# Patient Record
Sex: Male | Born: 1935
Health system: Southern US, Community
[De-identification: ages and names within clinical notes are randomized; demographics above are authoritative.]

## PROBLEM LIST (undated history)

## (undated) DIAGNOSIS — M1990 Unspecified osteoarthritis, unspecified site: Secondary | ICD-10-CM

## (undated) HISTORY — PX: MINOR HEMORRHOIDECTOMY: SHX6238

---

## 2001-01-22 ENCOUNTER — Ambulatory Visit (HOSPITAL_COMMUNITY): Admission: RE | Admit: 2001-01-22 | Discharge: 2001-01-22 | Payer: Self-pay | Admitting: Gastroenterology

## 2011-02-02 NOTE — Procedures (Signed)
[facility]  Patient:    Bryan Garza, Bryan Garza                     MRN: [id number] Proc. Date: 01/22/01 Adm. Date:  [id number] Attending:  Orland Mustard CC:         Hadassah Pais. Jeannetta Nap, M.D.   Procedure Report  PROCEDURE:  Colonoscopy.  MEDICATIONS:  Fentanyl 100 mcg, Versed 7.5 mg IV.  INDICATIONS:  A previous history of adenomatous polyps removed in [place]. This is done as a five year follow-up.  The patient actually did not have a five year follow-up, and this is the first colonoscopy in 10 years since those polyps were removed.  DESCRIPTION OF PROCEDURE:  The procedure had been explained to the patient and consent obtained.  With the patient in the left lateral decubitus position, the adult Olympus video colonoscope was inserted and advanced under direct visualization.  The prep was excellent.  We were able to advance to the cecum without difficulty.  The right lower quadrant was transilluminated, and the ileocecal valve was seen.  The scope was withdrawn.  The cecum, ascending colon, hepatic flexure, transverse colon, splenic flexure, descending, and sigmoid colon were seen well upon removal.  No polyps were seen.  There was no significant diverticula or internal hemorrhoids.  The patient tolerated the procedure well and was maintained on low-flow oxygen and pulse oximeter throughout the procedure.  ASSESSMENT:  No evidence of further colon polyps.  PLAN:  We will recommend repeating procedure in five years and will continue with yearly Hemoccults. DD:  01/22/01 TD:  01/22/01 Job: [id number]

## 2016-04-04 DIAGNOSIS — L2089 Other atopic dermatitis: Secondary | ICD-10-CM | POA: Diagnosis not present

## 2017-01-12 ENCOUNTER — Encounter: Payer: Self-pay | Admitting: Medical Oncology

## 2017-01-12 ENCOUNTER — Emergency Department
Admission: EM | Admit: 2017-01-12 | Discharge: 2017-01-12 | Disposition: A | Discharge status: Home / Self Care | Payer: PPO | Attending: Emergency Medicine | Admitting: Emergency Medicine

## 2017-01-12 DIAGNOSIS — K0889 Other specified disorders of teeth and supporting structures: Secondary | ICD-10-CM | POA: Diagnosis not present

## 2017-01-12 DIAGNOSIS — K029 Dental caries, unspecified: Secondary | ICD-10-CM | POA: Diagnosis not present

## 2017-01-12 DIAGNOSIS — K047 Periapical abscess without sinus: Secondary | ICD-10-CM | POA: Insufficient documentation

## 2017-01-12 MED ORDER — AMOXICILLIN 500 MG PO CAPS: 500.0000 mg | ORAL_CAPSULE | Freq: Three times a day (TID) | ORAL | 0 refills | Status: DC

## 2017-01-12 NOTE — ED Notes (Signed)
Patient is complaining of swelling to the left side of his mouth and foul smell.  Patient is also complaining of pain and burning when he blows his nose.  Patient is in no obvious distress at this time.  Patient states he spoke to his dentist about the swelling and his dentist didn't want to put patient on antibiotics.

## 2017-01-12 NOTE — Discharge Instructions (Signed)
OPTIONS FOR DENTAL FOLLOW UP CARE ° °[place] Department of Health and Human Services - Local Safety Net Dental Clinics °http://www.ncdhhs.gov/dph/oralhealth/services/safetynetclinics.htm °  °[facility] (336-562-3123) ° °[facility] [place] (919-933-9087) ° °[facility] (919-663-1744 ext [phone]) ° °[facility] ([phone]) ° °[facility] ([phone]) °This clinic caters to the indigent population and is on a lottery system. °Location: °[facility], [address], [place] °Clinic Hours: °Wednesdays from 6pm - 9pm, patients seen by a lottery system. °For dates, call or go to www.med.[facility].edu/[facility]/patients/Dental-[facility] °Services: °Cleanings, fillings and simple extractions. °Payment Options: °DENTAL WORK IS FREE OF CHARGE. Bring proof of income or support. °Best way to get seen: °Arrive at 5:15 pm - this is a lottery, NOT first come/first serve, so arriving earlier will not increase your chances of being seen. °  °  °[facility] °919-537-3737 °Select option 1 for emergencies °  °Location: °[facility], [address], [place] °Clinic Hours: °No walk-ins accepted - call the day before to schedule an appointment. °Check in times are 9:30 am and 1:30 pm. °Services: °Simple extractions, temporary fillings, pulpectomy/pulp debridement, uncomplicated abscess drainage. °Payment Options: °PAYMENT IS DUE AT THE TIME OF SERVICE.  Fee is usually $100-200, additional surgical procedures (e.g. abscess drainage) may be extra. °Cash, checks, Visa/MasterCard accepted.  Can file Medicaid if patient is covered for dental - patient should call case worker to check. °No discount for [facility] patients. °Best way to get seen: °MUST call the day before and get onto the schedule. Can usually be seen the next 1-2 days. No walk-ins accepted. °  °  °[facility] °[phone] °   °Location: °[facility], [address], [place] °Clinic Hours: °M, W, Th, F 8am or 1:30pm, Tues 9a or 1:30 - first come/first served. °Services: °Simple extractions, temporary fillings, uncomplicated abscess drainage.  You do not need to be an [place] resident. °Payment Options: °PAYMENT IS DUE AT THE TIME OF SERVICE. °Dental insurance, otherwise sliding scale - bring proof of income or support. °Depending on income and treatment needed, cost is usually $50-200. °Best way to get seen: °Arrive early as it is first come/first served. °  °  °[facility] °[phone] °  °Location: °[address] °Clinic Hours: °Mon-Thu 8a-5p °Services: °Most basic dental services including extractions and fillings. °Payment Options: °PAYMENT IS DUE AT THE TIME OF SERVICE. °Sliding scale, up to 50% off - bring proof if income or support. °Medicaid with dental option accepted. °Best way to get seen: °Call to schedule an appointment, can usually be seen within 2 weeks OR they will try to see walk-ins - show up at 8a or 2p (you may have to wait). °  °  °[facility] °919-245-2435 °[place] RESIDENTS ONLY °  °Location: °[facility], [address], [place], [place] [address] °Clinic Hours: By appointment only. °Monday - Thursday 8am-5pm, Friday 8am-12pm °Services: Cleanings, fillings, extractions. °Payment Options: °PAYMENT IS DUE AT THE TIME OF SERVICE. °Cash, Visa or MasterCard. Sliding scale - $30 minimum per service. °Best way to get seen: °Come in to office, complete packet and make an appointment - need proof of income °or support monies for each household member and proof of [place] residence. °Usually takes about a month to get in. °  °  °[facility] °[phone] °  °Location: °[address]   [place] °Clinic Hours: Walk-in Urgent Care Dental Services are offered Monday-Friday  mornings only. °The numbers of emergencies accepted daily is limited to the number of °providers available. °Maximum 15 - Mondays, Wednesdays & Thursdays °Maximum 10 - Tuesdays & Fridays °Services: °You do not need to be a  County resident to be seen for a dental emergency. °Emergencies are defined as pain, swelling, abnormal bleeding, or dental trauma. Walkins will receive x-rays if needed. °NOTE: Dental cleaning is not an emergency. °Payment Options: °PAYMENT IS DUE AT THE TIME OF SERVICE. °Minimum co-pay is $40.00 for uninsured patients. °Minimum co-pay is $3.00 for Medicaid with dental coverage. °Dental Insurance is accepted and must be presented at time of visit. °Medicare does not cover dental. °Forms of payment: Cash, credit card, checks. °Best way to get seen: °If not previously registered with the clinic, walk-in dental registration begins at 7:15 am and is on a first come/first serve basis. °If previously registered with the clinic, call to make an appointment. °  °  °[facility] °919-776-4359 °[place] RESIDENTS ONLY °  °Location: °[address], [place], [place] °Clinic Hours: °Mon-Thu 10a-2p °Services: Extractions only! °Payment Options: °FREE (donations accepted) - bring proof of income or support °Best way to get seen: °Call and schedule an appointment OR come at 8am on the 1st Monday of every month (except for holidays) when it is first come/first served. °  °  °[facility] °[phone] °  °Location: °[address], [place] °Clinic Hours: °Friday mornings °Services, Payment Options, Best way to get seen: °Call for info °

## 2017-01-12 NOTE — ED Provider Notes (Signed)
[facility] Emergency Department Provider Note  ____________________________________________  Time seen: Approximately 11:45 AM  I have reviewed the triage vital signs and the nursing notes.   HISTORY  Chief Complaint Dental Pain and Facial Pain    HPI Bryan Garza is a 83 y.o. male that presents to emergency with left-sided facial pain for 12 hours. Patient states that in the middle of the night foul-smelling drainage came out of patient's nose. He has not noticed any drainage today. Patient states that he has a bad lower molar. He saw a dentist last week and was not given antibiotics. Patient states that dentist was not sure what to do about patient's teeth so he was referred to another dentist. Patient never followed up with another dentist.He does not have a primary care provider here because he states that "he never gets sick. " Patient denies any recent illness.  No history of sinus infections. He denies fever, difficulty opening and closing mouth, shortness of breath, chest pain, nausea, vomiting, abdominal pain.   History reviewed. No pertinent past medical history.  There are no active problems to display for this patient.   No past surgical history on file.  Prior to Admission medications   Medication Sig Start Date End Date Taking? Authorizing Provider  amoxicillin (AMOXIL) 500 MG capsule Take 1 capsule (500 mg total) by mouth 3 (three) times daily. 01/12/17   Enid Derry, PA-C    Allergies Tetracyclines & related  No family history on file.  Social History Social History  Substance Use Topics  . Smoking status: Not on file  . Smokeless tobacco: Not on file  . Alcohol use Not on file     Review of Systems  Constitutional: No fever/chills Eyes: No visual changes. No discharge. ENT: Positive for rhinorrhea. Cardiovascular: No chest pain. Respiratory: Negative for cough. No SOB. Gastrointestinal: No abdominal pain.  No nausea,  no vomiting.  No diarrhea.  No constipation. Musculoskeletal: Negative for musculoskeletal pain. Skin: Negative for rash, abrasions, lacerations, ecchymosis. Neurological: Negative for headaches.   ____________________________________________   PHYSICAL EXAM:  VITAL SIGNS: ED Triage Vitals  Enc Vitals Group     BP 01/12/17 1056 (!) 154/87     Pulse Rate 01/12/17 1056 100     Resp 01/12/17 1056 18     Temp 01/12/17 1056 98.5 F (36.9 C)     Temp Source 01/12/17 1056 Oral     SpO2 01/12/17 1056 97 %     Weight 01/12/17 1051 155 lb (70.3 kg)     Height 01/12/17 1051  (1.702 m)     Head Circumference --      Peak Flow --      Pain Score 01/12/17 1050 0     Pain Loc --      Pain Edu? --      Excl. in GC? --      Constitutional: Alert and oriented. Well appearing and in no acute distress. Eyes: Conjunctivae are normal. PERRL. EOMI. No discharge. Head: Atraumatic. ENT: No frontal and maxillary sinus tenderness.      Ears: Tympanic membranes pearly gray with good landmarks. No discharge.      Nose: No congestion/rhinnorhea.      Mouth/Throat: Mucous membranes are moist. Oropharynx non-erythematous. Uvula midline. Poor dentition. Back left molar has gold crown. 3 mm firm mass adjacent to back left molar. Mild tenderness to palpation. Poor dentition throughout. No drainage. No TMJ pain. Neck: No stridor.   Hematological/Lymphatic/Immunilogical:  No cervical lymphadenopathy. Cardiovascular: Normal rate, regular rhythm.  Good peripheral circulation. Respiratory: Normal respiratory effort without tachypnea or retractions. Lungs CTAB. Good air entry to the bases with no decreased or absent breath sounds. Musculoskeletal: Full range of motion to all extremities. No gross deformities appreciated. Neurologic:  Normal speech and language. No gross focal neurologic deficits are appreciated.  Skin:  Skin is warm, dry and intact. No rash  noted.   ____________________________________________   LABS (all labs ordered are listed, but only abnormal results are displayed)  Labs Reviewed - No data to display ____________________________________________  EKG   ____________________________________________  RADIOLOGY  No results found.  ____________________________________________    PROCEDURES  Procedure(s) performed:    Procedures    Medications - No data to display   ____________________________________________   INITIAL IMPRESSION / ASSESSMENT AND PLAN / ED COURSE  Pertinent labs & imaging results that were available during my care of the patient were reviewed by me and considered in my medical decision making (see chart for details).  Review of the [place] CSRS was performed in accordance of the [facility] prior to dispensing any controlled drugs.    Patient's diagnosis is consistent with dental cavities. Vital signs and exam are reassuring. Patient states that mass next to back left molar is the reason that he saw the dentist last week and was referred to another dentist. Patient has poor dentition. No swelling or tenderness over sinuses or over cheek. Patient appears well and is staying well hydrated. Patient will be discharged home with prescriptions for amoxicillin. Patient is to follow up with dentist as needed or otherwise directed. Patient is given ED precautions to return to the ED for any worsening or new symptoms.     ____________________________________________  FINAL CLINICAL IMPRESSION(S) / ED DIAGNOSES  Final diagnoses:  Dental caries  Dental infection      NEW MEDICATIONS STARTED DURING THIS VISIT:  Discharge Medication List as of 01/12/2017 12:25 PM    START taking these medications   Details  amoxicillin (AMOXIL) 500 MG capsule Take 1 capsule (500 mg total) by mouth 3 (three) times daily., Starting Sat 01/12/2017, Print            This chart was dictated using voice  recognition software/Dragon. Despite best efforts to proofread, errors can occur which can change the meaning. Any change was purely unintentional.    Enid Derry, PA-C 01/12/17 1319    Jeanmarie Plant, MD 01/13/17 [id number]

## 2017-01-12 NOTE — ED Triage Notes (Signed)
Pt reports sinus pressure and pain that began yesterday.

## 2017-03-13 ENCOUNTER — Emergency Department
Admission: EM | Admit: 2017-03-13 | Discharge: 2017-03-13 | Disposition: A | Discharge status: Home / Self Care | Payer: PPO | Attending: Emergency Medicine | Admitting: Emergency Medicine

## 2017-03-13 ENCOUNTER — Encounter: Payer: Self-pay | Admitting: Emergency Medicine

## 2017-03-13 DIAGNOSIS — Z87891 Personal history of nicotine dependence: Secondary | ICD-10-CM | POA: Diagnosis not present

## 2017-03-13 DIAGNOSIS — R432 Parageusia: Secondary | ICD-10-CM | POA: Diagnosis not present

## 2017-03-13 DIAGNOSIS — R0981 Nasal congestion: Secondary | ICD-10-CM | POA: Insufficient documentation

## 2017-03-13 DIAGNOSIS — J0191 Acute recurrent sinusitis, unspecified: Secondary | ICD-10-CM | POA: Diagnosis not present

## 2017-03-13 MED ORDER — AMOXICILLIN 500 MG PO CAPS: 500.0000 mg | ORAL_CAPSULE | Freq: Three times a day (TID) | ORAL | 0 refills | Status: DC

## 2017-03-13 MED ORDER — FLUTICASONE PROPIONATE 50 MCG/ACT NA SUSP: 2.0000 | Freq: Every day | NASAL | 0 refills | Status: DC

## 2017-03-13 NOTE — ED Triage Notes (Signed)
Patient ambulatory to triage with steady gait, without difficulty or distress noted; pt reports 2 months ago had an dental abscess with odor; had extraction; this morning blew nose "and it burnt like fire and I started smelling that smell again, like a dead animal"

## 2017-03-13 NOTE — Discharge Instructions (Signed)
Follow-up with your doctor at [facility] if any continued problems. Begin taking antibiotics today for the next 10 days and begin using Flonase nasal spray. This spray goes in each nostril once a day.  Continue taking Zyrtec every day.

## 2017-03-13 NOTE — ED Notes (Signed)
See triage note  States he woke up with some nasal drainage  Blew his nose and then he developed an odor  No fever

## 2017-03-13 NOTE — ED Provider Notes (Signed)
[facility] Emergency Department Provider Note ____________________________________________  Time seen: 7:18 AM  I have reviewed the triage vital signs and the nursing notes.  HISTORY  Chief Complaint  mouth odor   HPI Bryan Garza is a 83 y.o. male is here with complaint of having an odor and bad taste in his mouth. Patient states that last time this occurred he had a sinus infection. Patient states he was given antibiotics and this improved greatly. He states this morning he woke up with the same symptoms. He continues to take Zyrtec over-the-counter for his allergy symptoms. He states occasionally when he blows his nose he also gets a lot of "infection" out of it. Patient denies any fever or chills. He states he had a dental abscess 2 months ago but had these teeth extracted. He denies any dental pain. He denies any pain only a bad since of smell.  History reviewed. No pertinent past medical history.  There are no active problems to display for this patient.   Past Surgical History:  Procedure Laterality Date  . MINOR HEMORRHOIDECTOMY      Prior to Admission medications   Medication Sig Start Date End Date Taking? Authorizing Provider  amoxicillin (AMOXIL) 500 MG capsule Take 1 capsule (500 mg total) by mouth 3 (three) times daily. 03/13/17   Tommi Rumps, PA-C  fluticasone (FLONASE) 50 MCG/ACT nasal spray Place 2 sprays into both nostrils daily. 03/13/17 03/13/18  Tommi Rumps, PA-C    Allergies Tetracyclines & related  No family history on file.  Social History Social History  Substance Use Topics  . Smoking status: Former Games developer  . Smokeless tobacco: Never Used  . Alcohol use No    Review of Systems  Constitutional: Negative for fever. Eyes: Negative for visual changes. ENT: Negative for sore throat. Positive for nasal congestion. Cardiovascular: Negative for chest pain. Respiratory: Negative for shortness of breath. Skin:  Negative for rash. Neurological: Negative for headaches, focal weakness or numbness. ____________________________________________  PHYSICAL EXAM:  VITAL SIGNS: ED Triage Vitals  Enc Vitals Group     BP 03/13/17 0604 (!) 175/95     Pulse Rate 03/13/17 0604 82     Resp 03/13/17 0604 20     Temp 03/13/17 0604 97.7 F (36.5 C)     Temp Source 03/13/17 0604 Oral     SpO2 03/13/17 0604 100 %     Weight 03/13/17 0602 155 lb (70.3 kg)     Height 03/13/17 0602 5\' 6"  (1.676 m)     Head Circumference --      Peak Flow --      Pain Score --      Pain Loc --      Pain Edu? --      Excl. in GC? --     Constitutional: Alert and oriented. Well appearing and in no distress. Head: Normocephalic and atraumatic. Eyes: Conjunctivae are normal.  Ears: Canals clear. TMs intact bilaterally. Nose: Mild congestion/rhinorrhea. Tenderness maxillary sinuses bilaterally to percussion. Mouth/Throat: Mucous membranes are moist. Moderate posterior drainage. Neck: Supple.  Hematological/Lymphatic/Immunological: No cervical lymphadenopathy. Cardiovascular: Normal rate, regular rhythm. Normal distal pulses. Respiratory: Normal respiratory effort. No wheezes/rales/rhonchi. Neurologic:  Normal gait without ataxia. Normal speech and language. No gross focal neurologic deficits are appreciated. Skin:  Skin is warm, dry and intact. No rash noted. Psychiatric: Mood and affect are normal. Patient exhibits appropriate insight and judgment. ___________________________________________  INITIAL IMPRESSION / ASSESSMENT AND PLAN / ED COURSE  Patient was given prescription for amoxicillin 500 mg 3 times a day for 10 days along with Flonase nasal spray. He is to continue taking Zyrtec daily as he has been doing. He is to follow-[facility] with [facility] if any continued problems.    ____________________________________________  FINAL CLINICAL IMPRESSION(S) / ED DIAGNOSES  Final diagnoses:  Acute  recurrent sinusitis, unspecified location     Eather ColasSummers, Rhonda L, PA-C 03/13/17 1549    Sharman CheekStafford, Phillip, MD 03/15/17 2317

## 2017-03-26 DIAGNOSIS — J0100 Acute maxillary sinusitis, unspecified: Secondary | ICD-10-CM | POA: Diagnosis not present

## 2017-03-26 DIAGNOSIS — J301 Allergic rhinitis due to pollen: Secondary | ICD-10-CM | POA: Diagnosis not present

## 2017-04-04 ENCOUNTER — Emergency Department
Admission: EM | Admit: 2017-04-04 | Discharge: 2017-04-04 | Disposition: A | Discharge status: Home / Self Care | Payer: PPO | Attending: Emergency Medicine | Admitting: Emergency Medicine

## 2017-04-04 ENCOUNTER — Encounter: Payer: Self-pay | Admitting: *Deleted

## 2017-04-04 DIAGNOSIS — R197 Diarrhea, unspecified: Secondary | ICD-10-CM | POA: Insufficient documentation

## 2017-04-04 DIAGNOSIS — Z87891 Personal history of nicotine dependence: Secondary | ICD-10-CM | POA: Insufficient documentation

## 2017-04-04 LAB — CBC WITH DIFFERENTIAL/PLATELET
Basophils Absolute: 0 10*3/uL (ref 0–0.1)
Basophils Relative: 0 %
EOS ABS: 0.1 10*3/uL (ref 0–0.7)
Eosinophils Relative: 1 %
HCT: 45.1 % (ref 40.0–52.0)
Hemoglobin: 15.2 g/dL (ref 13.0–18.0)
LYMPHS ABS: 2.6 10*3/uL (ref 1.0–3.6)
Lymphocytes Relative: 44 %
MCH: 30.3 pg (ref 26.0–34.0)
MCHC: 33.7 g/dL (ref 32.0–36.0)
MCV: 89.7 fL (ref 80.0–100.0)
MONO ABS: 0.5 10*3/uL (ref 0.2–1.0)
MONOS PCT: 9 %
Neutro Abs: 2.7 10*3/uL (ref 1.4–6.5)
Neutrophils Relative %: 46 %
PLATELETS: 219 10*3/uL (ref 150–440)
RBC: 5.02 MIL/uL (ref 4.40–5.90)
RDW: 13.6 % (ref 11.5–14.5)
WBC: 5.8 10*3/uL (ref 3.8–10.6)

## 2017-04-04 LAB — GASTROINTESTINAL PANEL BY PCR, STOOL (REPLACES STOOL CULTURE)
ASTROVIRUS: NOT DETECTED
Adenovirus F40/41: NOT DETECTED
Campylobacter species: NOT DETECTED
Cryptosporidium: NOT DETECTED
Cyclospora cayetanensis: NOT DETECTED
ENTAMOEBA HISTOLYTICA: NOT DETECTED
ENTEROAGGREGATIVE E COLI (EAEC): NOT DETECTED
ENTEROPATHOGENIC E COLI (EPEC): NOT DETECTED
ENTEROTOXIGENIC E COLI (ETEC): NOT DETECTED
Giardia lamblia: NOT DETECTED
NOROVIRUS GI/GII: NOT DETECTED
Plesimonas shigelloides: NOT DETECTED
Rotavirus A: NOT DETECTED
SAPOVIRUS (I, II, IV, AND V): NOT DETECTED
SHIGA LIKE TOXIN PRODUCING E COLI (STEC): NOT DETECTED
Salmonella species: NOT DETECTED
Shigella/Enteroinvasive E coli (EIEC): NOT DETECTED
VIBRIO CHOLERAE: NOT DETECTED
Vibrio species: NOT DETECTED
Yersinia enterocolitica: NOT DETECTED

## 2017-04-04 LAB — COMPREHENSIVE METABOLIC PANEL
ALT: 17 U/L (ref 17–63)
ANION GAP: 10 (ref 5–15)
AST: 21 U/L (ref 15–41)
Albumin: 4.3 g/dL (ref 3.5–5.0)
Alkaline Phosphatase: 50 U/L (ref 38–126)
BUN: 21 mg/dL — ABNORMAL HIGH (ref 6–20)
CHLORIDE: 104 mmol/L (ref 101–111)
CO2: 25 mmol/L (ref 22–32)
Calcium: 9.7 mg/dL (ref 8.9–10.3)
Creatinine, Ser: 1.19 mg/dL (ref 0.61–1.24)
GFR, EST NON AFRICAN AMERICAN: 56 mL/min — AB (ref >60)
Glucose, Bld: 112 mg/dL — ABNORMAL HIGH (ref 65–99)
Potassium: 4 mmol/L (ref 3.5–5.1)
SODIUM: 139 mmol/L (ref 135–145)
Total Bilirubin: 1 mg/dL (ref 0.3–1.2)
Total Protein: 7 g/dL (ref 6.5–8.1)

## 2017-04-04 LAB — C DIFFICILE QUICK SCREEN W PCR REFLEX
C DIFFICLE (CDIFF) ANTIGEN: NEGATIVE
C Diff interpretation: NOT DETECTED
C Diff toxin: NEGATIVE

## 2017-04-04 NOTE — ED Provider Notes (Signed)
[facility] Emergency Department Provider Note       Time seen: ----------------------------------------- 9:51 AM on 04/04/2017 -----------------------------------------     I have reviewed the triage vital signs and the nursing notes.   HISTORY   Chief Complaint Diarrhea    HPI Bryan Garza is a 83 y.o. male who presents to the ED for diarrhea. Patient states he had diarrhea several times this morning as well as last night. He is currently on clindamycin for treatment of a sinus infection or dental abscess. Prior to that he was taking amoxicillin. Patient is concerned the infection is no better and that his symptoms indicate worsening infections. Patient states acid from the infection is hurting his stomach. He's been taking Pepto-Bismol with some relief.   History reviewed. No pertinent past medical history.  There are no active problems to display for this patient.   Past Surgical History:  Procedure Laterality Date  . MINOR HEMORRHOIDECTOMY      Allergies Tetracyclines & related  Social History Social History  Substance Use Topics  . Smoking status: Former Games developermoker  . Smokeless tobacco: Never Used  . Alcohol use No    Review of Systems Constitutional: Negative for fever. Eyes: Negative for vision changes ENT:  Negative for congestion, sore throat Cardiovascular: Negative for chest pain. Respiratory: Negative for shortness of breath. Gastrointestinal: Negative for abdominal pain,  Positive for GERD and diarrhea Genitourinary: Negative for dysuria. Musculoskeletal: Negative for back pain. Skin: Negative for rash. Neurological: Negative for headaches, focal weakness or numbness.  All systems negative/normal/unremarkable except as stated in the HPI  ____________________________________________   PHYSICAL EXAM:  VITAL SIGNS: ED Triage Vitals [04/04/17 0906]  Enc Vitals Group     BP (!) 158/105     Pulse Rate 85     Resp  15     Temp 98.6 F (37 C)     Temp Source Oral     SpO2 98 %     Weight 155 lb (70.3 kg)     Height 5\' 7"  (1.702 m)     Head Circumference      Peak Flow      Pain Score      Pain Loc      Pain Edu?      Excl. in GC?     Constitutional: Alert and oriented. Well appearing and in no distress. Eyes: Conjunctivae are normal. Normal extraocular movements. ENT   Head: Normocephalic and atraumatic.   Nose: No congestion/rhinnorhea.   Mouth/Throat: Mucous membranes are moist.Intraoral exam is normal   Neck: No stridor. Cardiovascular: Normal rate, regular rhythm. No murmurs, rubs, or gallops. Respiratory: Normal respiratory effort without tachypnea nor retractions. Breath sounds are clear and equal bilaterally. No wheezes/rales/rhonchi. Gastrointestinal: Soft and nontender. Normal bowel sounds Musculoskeletal: Nontender with normal range of motion in extremities. No lower extremity tenderness nor edema. Neurologic:  Normal speech and language. No gross focal neurologic deficits are appreciated.  Skin:  Skin is warm, dry and intact. No rash noted. Psychiatric: Mood and affect are normal. Speech and behavior are normal.  ____________________________________________  ED COURSE:  Pertinent labs & imaging results that were available during my care of the patient were reviewed by me and considered in my medical decision making (see chart for details). Patient presents for likely diarrhea secondary to antibiotic use, we will assess with labs and imaging as indicated.   Procedures ____________________________________________   LABS (pertinent positives/negatives)  Labs Reviewed  COMPREHENSIVE METABOLIC PANEL -  Abnormal; Notable for the following:       Result Value   Glucose, Bld 112 (*)    BUN 21 (*)    GFR calc non Af Amer 56 (*)    All other components within normal limits  C DIFFICILE QUICK SCREEN W PCR REFLEX  GASTROINTESTINAL PANEL BY PCR, STOOL (REPLACES STOOL  CULTURE)  CBC WITH DIFFERENTIAL/PLATELET  ____________________________________________  FINAL ASSESSMENT AND PLAN  Diarrhea  Plan: Patient's labs were dictated above. Patient had presented for diarrhea and I have advised that he stop his antibiotics that he is taking orally. I don't see any active infection and I have encouraged him to continue taking probiotics at home. He is stable for outpatient follow-up.   Emily Filbert, MD   Note: This note was generated in part or whole with voice recognition software. Voice recognition is usually quite accurate but there are transcription errors that can and very often do occur. I apologize for any typographical errors that were not detected and corrected.     Emily Filbert, MD 04/04/17 1226

## 2017-04-04 NOTE — ED Notes (Signed)
Pt given warm blanket, pt made aware of need for stool sample, denies any needs

## 2017-04-04 NOTE — ED Triage Notes (Signed)
Pt arrives from home, states he is currently on clindamycin for a sinus infection left dental abscess, states he was already on amxocillin without improvement, states "the acid from the infection is hurting my stomach", states he has been taking pepto bismal in the AM for relief, states diarrhea for 1 week, denies any pain at present, pt awake and alert in no acute distress

## 2018-01-08 ENCOUNTER — Emergency Department
Admission: EM | Admit: 2018-01-08 | Discharge: 2018-01-09 | Disposition: A | Discharge status: Home / Self Care | Payer: PPO | Attending: Emergency Medicine | Admitting: Emergency Medicine

## 2018-01-08 ENCOUNTER — Encounter: Payer: Self-pay | Admitting: Emergency Medicine

## 2018-01-08 ENCOUNTER — Other Ambulatory Visit: Payer: Self-pay

## 2018-01-08 ENCOUNTER — Emergency Department: Payer: PPO

## 2018-01-08 DIAGNOSIS — K5900 Constipation, unspecified: Secondary | ICD-10-CM

## 2018-01-08 DIAGNOSIS — Z79899 Other long term (current) drug therapy: Secondary | ICD-10-CM | POA: Diagnosis not present

## 2018-01-08 DIAGNOSIS — R110 Nausea: Secondary | ICD-10-CM | POA: Diagnosis not present

## 2018-01-08 DIAGNOSIS — Z87891 Personal history of nicotine dependence: Secondary | ICD-10-CM | POA: Insufficient documentation

## 2018-01-08 DIAGNOSIS — R109 Unspecified abdominal pain: Secondary | ICD-10-CM | POA: Diagnosis not present

## 2018-01-08 DIAGNOSIS — K5641 Fecal impaction: Secondary | ICD-10-CM | POA: Insufficient documentation

## 2018-01-08 DIAGNOSIS — R197 Diarrhea, unspecified: Secondary | ICD-10-CM | POA: Diagnosis not present

## 2018-01-08 LAB — COMPREHENSIVE METABOLIC PANEL
ALBUMIN: 4.8 g/dL (ref 3.5–5.0)
ALK PHOS: 53 U/L (ref 38–126)
ALT: 19 U/L (ref 17–63)
AST: 26 U/L (ref 15–41)
Anion gap: 9 (ref 5–15)
BILIRUBIN TOTAL: 1.1 mg/dL (ref 0.3–1.2)
BUN: 23 mg/dL — AB (ref 6–20)
CO2: 26 mmol/L (ref 22–32)
CREATININE: 1.15 mg/dL (ref 0.61–1.24)
Calcium: 9.7 mg/dL (ref 8.9–10.3)
Chloride: 104 mmol/L (ref 101–111)
GFR calc Af Amer: >60 mL/min (ref >60)
GFR, EST NON AFRICAN AMERICAN: 58 mL/min — AB (ref >60)
Glucose, Bld: 128 mg/dL — ABNORMAL HIGH (ref 65–99)
Potassium: 4 mmol/L (ref 3.5–5.1)
Sodium: 139 mmol/L (ref 135–145)
TOTAL PROTEIN: 7.3 g/dL (ref 6.5–8.1)

## 2018-01-08 LAB — CBC
HCT: 44.6 % (ref 40.0–52.0)
Hemoglobin: 15.2 g/dL (ref 13.0–18.0)
MCH: 30.8 pg (ref 26.0–34.0)
MCHC: 34 g/dL (ref 32.0–36.0)
MCV: 90.5 fL (ref 80.0–100.0)
Platelets: 191 10*3/uL (ref 150–440)
RBC: 4.92 MIL/uL (ref 4.40–5.90)
RDW: 13.9 % (ref 11.5–14.5)
WBC: 13 10*3/uL — AB (ref 3.8–10.6)

## 2018-01-08 LAB — LIPASE, BLOOD: Lipase: 21 U/L (ref 11–51)

## 2018-01-08 MED ORDER — IOPAMIDOL (ISOVUE-300) INJECTION 61%
100.0000 mL | Freq: Once | INTRAVENOUS | Status: AC | PRN
  Administered 2018-01-08: 100 mL via INTRAVENOUS

## 2018-01-08 MED ORDER — SODIUM CHLORIDE 0.9 % IV BOLUS
1000.0000 mL | Freq: Once | INTRAVENOUS | Status: AC
Start: 2018-01-08 — End: 2018-01-09
  Administered 2018-01-09: 1000 mL via INTRAVENOUS

## 2018-01-08 NOTE — ED Provider Notes (Signed)
[facility] Emergency Department Provider Note  ____________________________________________   First MD Initiated Contact with Patient 01/08/18 2326     (approximate)  I have reviewed the triage vital signs and the nursing notes.   HISTORY  Chief Complaint Constipation and Abdominal Pain    HPI Bryan Garza is a 84 y.o. male who self presents to the emergency department with abdominal pain and nausea and constipation for the past 24 hours or so.  He is taken multiple over-the-counter medications with no relief.  He has not had a solid bowel movement in over 24 hours.  He denies history of abdominal surgeries.  His symptoms are now moderate to severe and he is quite distressed.  His abdominal pain is moderate to severe nonradiating.  Nothing seems to make it better or worse.  History reviewed. No pertinent past medical history.  There are no active problems to display for this patient.   Past Surgical History:  Procedure Laterality Date  . MINOR HEMORRHOIDECTOMY      Prior to Admission medications   Medication Sig Start Date End Date Taking? Authorizing Provider  amoxicillin (AMOXIL) 500 MG capsule Take 1 capsule (500 mg total) by mouth 3 (three) times daily. Patient not taking: Reported on 04/04/2017 03/13/17   Tommi RumpsSummers, Rhonda L, PA-C  fluticasone [facility](FLONASE) 50 MCG/ACT nasal spray Place 2 sprays into both nostrils daily. 03/13/17 03/13/18  Tommi RumpsSummers, Rhonda L, PA-C    Allergies Tetracyclines & related  No family history on file.  Social History Social History   Tobacco Use  . Smoking status: Former Games developermoker  . Smokeless tobacco: Never Used  Substance Use Topics  . Alcohol use: No  . Drug use: Not on file    Review of Systems Constitutional: No fever/chills Eyes: No visual changes. ENT: No sore throat. Cardiovascular: Denies chest pain. Respiratory: Denies shortness of breath. Gastrointestinal: Positive for abdominal pain.  Positive for  nausea, no vomiting.  No diarrhea.  Has a 4 constipation. Genitourinary: Negative for dysuria. Musculoskeletal: Negative for back pain. Skin: Negative for rash. Neurological: Negative for headaches, focal weakness or numbness.   ____________________________________________   PHYSICAL EXAM:  VITAL SIGNS: ED Triage Vitals [01/08/18 1956]  Enc Vitals Group     BP 137/84     Pulse Rate (!) 109     Resp 18     Temp 98 F (36.7 C)     Temp Source Oral     SpO2 97 %     Weight 145 lb (65.8 kg)     Height 5\' 7"  (1.702 m)     Head Circumference      Peak Flow      Pain Score 7     Pain Loc      Pain Edu?      Excl. in GC?     Constitutional: Alert and oriented x4 appears quite uncomfortable nontoxic no diaphoresis speaks full sentences Eyes: PERRL EOMI. Head: Atraumatic. Nose: No congestion/rhinnorhea. Mouth/Throat: No trismus Neck: No stridor.   Cardiovascular: Normal rate, regular rhythm. Grossly normal heart sounds.  Good peripheral circulation. Respiratory: Normal respiratory effort.  No retractions. Lungs CTAB and moving good air Gastrointestinal: Soft abdomen diffuse mild tenderness with no rebound or guarding no peritonitis Musculoskeletal: No lower extremity edema   Neurologic:  Normal speech and language. No gross focal neurologic deficits are appreciated. Skin:  Skin is warm, dry and intact. No rash noted. Psychiatric: Mood and affect are normal. Speech and behavior are  normal.    ____________________________________________   DIFFERENTIAL includes but not limited to  Small bowel obstruction, large bowel obstruction, volvulus, functional constipation ____________________________________________   LABS (all labs ordered are listed, but only abnormal results are displayed)  Labs Reviewed  COMPREHENSIVE METABOLIC PANEL - Abnormal; Notable for the following components:      Result Value   Glucose, Bld 128 (*)    BUN 23 (*)    GFR calc non Af Amer 58 (*)     All other components within normal limits  CBC - Abnormal; Notable for the following components:   WBC 13.0 (*)    All other components within normal limits  LIPASE, BLOOD    Lab work reviewed by me shows elevated white count which is nonspecific and could be secondary to pain __________________________________________  EKG   ____________________________________________  RADIOLOGY  CT abdomen pelvis reviewed by me consistent with fecal impaction ____________________________________________   PROCEDURES  Procedure(s) performed: yes  ------------------------------------------------------------------------------------------------------------------- Fecal Disimpaction Procedure Note:  Performed by me:  Patient placed in the lateral recumbent position with knees drawn towards chest. Nurse present for patient support. Large amount of hard brown stool removed. No complications during procedure.   ------------------------------------------------------------------------------------------------------------------    Procedures  Critical Care performed: no  Observation: no ____________________________________________   INITIAL IMPRESSION / ASSESSMENT AND PLAN / ED COURSE  Pertinent labs & imaging results that were available during my care of the patient were reviewed by me and considered in my medical decision making (see chart for details).  The patient arrives uncomfortable appearing with constipation and nausea and diffuse abdominal pain.  CT scan abdomen pelvis obtained to evaluate for bowel obstruction etc. and fortunately is negative although does show a significant fecal impaction.  I verbally consented the patient for disimpaction and gave him 2 mg of midazolam for anxiolysis.  With the patient in left lateral decubitus and nurse Erie Noe at bedside I was able to remove a large volume of hard stools.  Subsequent soapsuds enema performed and the patient had a large bowel  movement on his own.  His symptoms are nearly completely resolved.  I have encouraged him to remain well-hydrated and increase amount of fiber in his diet.  He verbalizes understanding and agreement with the plan.  Strict return precautions have been given.      ____________________________________________   FINAL CLINICAL IMPRESSION(S) / ED DIAGNOSES  Final diagnoses:  Fecal impaction (HCC)  Constipation, unspecified constipation type      NEW MEDICATIONS STARTED DURING THIS VISIT:  Discharge Medication List as of 01/09/2018 12:56 AM       Note:  This document was prepared using Dragon voice recognition software and may include unintentional dictation errors.     Merrily Brittle, MD 01/09/18 2213

## 2018-01-08 NOTE — ED Triage Notes (Signed)
Patient ambulatory to triage with steady gait, without difficulty or distress noted; pt reports constipation; last BM yesterday; taking OTC med without relief accomp lowrer abd pain

## 2018-01-09 MED ORDER — MIDAZOLAM HCL 5 MG/5ML IJ SOLN
2.0000 mg | Freq: Once | INTRAMUSCULAR | Status: AC
Start: 2018-01-09 — End: 2018-01-09
  Administered 2018-01-09: 2 mg via INTRAVENOUS
  Filled 2018-01-09: qty 5

## 2018-01-09 NOTE — Discharge Instructions (Signed)
Please increase the amount of fiber in your diet and make sure you remain well-hydrated.  Follow-up with your primary care physician as needed and return to the emergency department for any concerns.  It was a pleasure to take care of you today, and thank you for coming to our emergency department.  If you have any questions or concerns before leaving please ask the nurse to grab me and I'm more than happy to go through your aftercare instructions again.  If you were prescribed any opioid pain medication today such as Norco, Vicodin, Percocet, morphine, hydrocodone, or oxycodone please make sure you do not drive when you are taking this medication as it can alter your ability to drive safely.  If you have any concerns once you are home that you are not improving or are in fact getting worse before you can make it to your follow-up appointment, please do not hesitate to call 911 and come back for further evaluation.  Merrily Brittle, MD  Results for orders placed or performed during the hospital encounter of 01/08/18  Lipase, blood  Result Value Ref Range   Lipase 21 11 - 51 U/L  Comprehensive metabolic panel  Result Value Ref Range   Sodium 139 135 - 145 mmol/L   Potassium 4.0 3.5 - 5.1 mmol/L   Chloride 104 101 - 111 mmol/L   CO2 26 22 - 32 mmol/L   Glucose, Bld 128 (H) 65 - 99 mg/dL   BUN 23 (H) 6 - 20 mg/dL   Creatinine, Ser 4.09 0.61 - 1.24 mg/dL   Calcium 9.7 8.9 - 81.1 mg/dL   Total Protein 7.3 6.5 - 8.1 g/dL   Albumin 4.8 3.5 - 5.0 g/dL   AST 26 15 - 41 U/L   ALT 19 17 - 63 U/L   Alkaline Phosphatase 53 38 - 126 U/L   Total Bilirubin 1.1 0.3 - 1.2 mg/dL   GFR calc non Af Amer 58 (L) >60 mL/min   GFR calc Af Amer >60 >60 mL/min   Anion gap 9 5 - 15  CBC  Result Value Ref Range   WBC 13.0 (H) 3.8 - 10.6 K/uL   RBC 4.92 4.40 - 5.90 MIL/uL   Hemoglobin 15.2 13.0 - 18.0 g/dL   HCT 91.4 78.2 - 95.6 %   MCV 90.5 80.0 - 100.0 fL   MCH 30.8 26.0 - 34.0 pg   MCHC 34.0 32.0 - 36.0  g/dL   RDW 21.3 08.6 - 57.8 %   Platelets 191 150 - 440 K/uL   Dg Abdomen 1 View  Result Date: 01/08/2018 CLINICAL DATA:  Abdominal pain.  Constipation. EXAM: ABDOMEN - 1 VIEW COMPARISON:  None. FINDINGS: The bowel gas pattern is normal. Stool burden is unremarkable. No radio-opaque calculi or other significant radiographic abnormality are seen. IMPRESSION: Negative exam.  Stool burden is unremarkable. Electronically Signed   By: Drusilla Kanner M.D.   On: 01/08/2018 20:19   Ct Abdomen Pelvis W Contrast  Result Date: 01/08/2018 CLINICAL DATA:  Constipation. Last bowel movement yesterday. Lower abdominal pain. EXAM: CT ABDOMEN AND PELVIS WITH CONTRAST TECHNIQUE: Multidetector CT imaging of the abdomen and pelvis was performed using the standard protocol following bolus administration of intravenous contrast. CONTRAST:  ISOVUE-300 IOPAMIDOL (ISOVUE-300) INJECTION 61% COMPARISON:  None. FINDINGS: Lower chest: Lung bases are clear. Hepatobiliary: No focal liver abnormality is seen. No gallstones, gallbladder wall thickening, or biliary dilatation. Pancreas: Unremarkable. No pancreatic ductal dilatation or surrounding inflammatory changes. Spleen: Normal in  size without focal abnormality. Adrenals/Urinary Tract: Adrenal glands are unremarkable. Kidneys are normal, without renal calculi, focal lesion, or hydronephrosis. Bladder is unremarkable. Stomach/Bowel: Rectosigmoid colon is stool filled. Remainder the colon is fluid-filled. This may reflect constipation or impaction versus an infectious colitis with diarrhea. There is no colonic wall thickening or inflammatory stranding. Appendix is not identified. Stomach and small bowel are mostly decompressed. Vascular/Lymphatic: Aortic atherosclerosis. No enlarged abdominal or pelvic lymph nodes. Reproductive: Prostate gland is enlarged, measuring 5.1 cm diameter. Other: No free air or free fluid in the abdomen. Abdominal wall musculature appears intact.  Musculoskeletal: Degenerative changes in the spine and hips. No destructive bone lesions. IMPRESSION: 1. Nondilated colon is fluid-filled with stool-filled rectosigmoid colon. This may reflect constipation or impaction versus infectious colitis. No colonic wall thickening or inflammatory stranding. 2. Aortic atherosclerosis. 3. Enlarged prostate gland. Electronically Signed   By: Burman NievesWilliam  Stevens M.D.   On: 01/08/2018 23:49

## 2018-08-21 ENCOUNTER — Emergency Department: Payer: PPO

## 2018-08-21 ENCOUNTER — Other Ambulatory Visit: Payer: Self-pay

## 2018-08-21 ENCOUNTER — Encounter: Payer: Self-pay | Admitting: Emergency Medicine

## 2018-08-21 ENCOUNTER — Emergency Department
Admission: EM | Admit: 2018-08-21 | Discharge: 2018-08-21 | Disposition: A | Discharge status: Home / Self Care | Payer: PPO | Attending: Emergency Medicine | Admitting: Emergency Medicine

## 2018-08-21 DIAGNOSIS — M47816 Spondylosis without myelopathy or radiculopathy, lumbar region: Secondary | ICD-10-CM | POA: Insufficient documentation

## 2018-08-21 DIAGNOSIS — Z87891 Personal history of nicotine dependence: Secondary | ICD-10-CM | POA: Insufficient documentation

## 2018-08-21 DIAGNOSIS — M545 Low back pain, unspecified: Secondary | ICD-10-CM

## 2018-08-21 DIAGNOSIS — I7090 Unspecified atherosclerosis: Secondary | ICD-10-CM | POA: Diagnosis not present

## 2018-08-21 DIAGNOSIS — M25552 Pain in left hip: Secondary | ICD-10-CM | POA: Diagnosis not present

## 2018-08-21 DIAGNOSIS — M4306 Spondylolysis, lumbar region: Secondary | ICD-10-CM | POA: Diagnosis not present

## 2018-08-21 HISTORY — DX: Unspecified osteoarthritis, unspecified site: M19.90

## 2018-08-21 LAB — URINALYSIS, COMPLETE (UACMP) WITH MICROSCOPIC
BACTERIA UA: NONE SEEN
Bilirubin Urine: NEGATIVE
Glucose, UA: NEGATIVE mg/dL
Hgb urine dipstick: NEGATIVE
KETONES UR: 20 mg/dL — AB
Leukocytes, UA: NEGATIVE
NITRITE: NEGATIVE
PROTEIN: NEGATIVE mg/dL
Specific Gravity, Urine: 1.021 (ref 1.005–1.030)
pH: 5 (ref 5.0–8.0)

## 2018-08-21 MED ORDER — LIDOCAINE 5 % EX PTCH: 1.0000 | MEDICATED_PATCH | CUTANEOUS | 0 refills | Status: DC

## 2018-08-21 NOTE — Discharge Instructions (Signed)
Please return to the emergency department if back pain returns or worsens, nausea, vomiting, abdominal pain, urinary symptoms, any other symptoms concerning to you.  Your x-ray shows that you have some arthritis and some chronic compression of your lumbar spine and some arthritis of your hip.  There is no infection or indication of a kidney stone on your urinalysis. Please call [facility] to establish with primary care.

## 2018-08-21 NOTE — ED Provider Notes (Addendum)
[facility] Emergency Department Provider Note  ____________________________________________  Time seen: Approximately 2:39 PM  I have reviewed the triage vital signs and the nursing notes.   HISTORY  Chief Complaint Back Pain    HPI Bryan Garza is a 84 y.o. male presents emergency department for evaluation of left lower back pain last night.  Pain was in one specific location and does not radiate.  Patient states that he cannot find a position to get comfortable in the last night.  Pain gradually improved this morning and has currently resolved.  He denies any pain now.  No urinary symptoms or hematuria.  He has never had a kidney stone.  No recent illness.  No nausea, vomiting, abdominal pain.   Past Medical History:  Diagnosis Date  . Arthritis     There are no active problems to display for this patient.   Past Surgical History:  Procedure Laterality Date  . MINOR HEMORRHOIDECTOMY      Prior to Admission medications   Medication Sig Start Date End Date Taking? Authorizing Provider  amoxicillin (AMOXIL) 500 MG capsule Take 1 capsule (500 mg total) by mouth 3 (three) times daily. Patient not taking: Reported on 04/04/2017 03/13/17   Tommi Rumps, PA-C  fluticasone [facility]) 50 MCG/ACT nasal spray Place 2 sprays into both nostrils daily. 03/13/17 03/13/18  Bridget Hartshorn L, PA-C  lidocaine (LIDODERM) 5 % Place 1 patch onto the skin daily. Remove & Discard patch within 12 hours or as directed by MD 08/21/18   Enid Derry, PA-C    Allergies Tetracyclines & related  No family history on file.  Social History Social History   Tobacco Use  . Smoking status: Former Games developer  . Smokeless tobacco: Never Used  Substance Use Topics  . Alcohol use: No  . Drug use: Never     Review of Systems  Constitutional: No fever/chills Cardiovascular: No chest pain. Respiratory: NNo SOB. Gastrointestinal: No abdominal pain.  No nausea, no  vomiting.  Musculoskeletal: Positive for back pain.  Skin: Negative for rash, abrasions, lacerations, ecchymosis. Neurological: Negative for headaches, numbness or tingling   ____________________________________________   PHYSICAL EXAM:  VITAL SIGNS: ED Triage Vitals  Enc Vitals Group     BP 08/21/18 1322 (!) 154/85     Pulse Rate 08/21/18 1322 76     Resp 08/21/18 1322 16     Temp 08/21/18 1322 98.2 F (36.8 C)     Temp Source 08/21/18 1322 Oral     SpO2 08/21/18 1322 98 %     Weight 08/21/18 1323 150 lb (68 kg)     Height 08/21/18 1323 5\' 6"  (1.676 m)     Head Circumference --      Peak Flow --      Pain Score 08/21/18 1323 3     Pain Loc --      Pain Edu? --      Excl. in GC? --      Constitutional: Alert and oriented. Well appearing and in no acute distress. Eyes: Conjunctivae are normal. PERRL. EOMI. Head: Atraumatic. ENT:      Ears:      Nose: No congestion/rhinnorhea.      Mouth/Throat: Mucous membranes are moist.  Neck: No stridor.  Cardiovascular: Normal rate, regular rhythm.  Good peripheral circulation. Respiratory: Normal respiratory effort without tachypnea or retractions. Lungs CTAB. Good air entry to the bases with no decreased or absent breath sounds. Gastrointestinal: Bowel sounds 4 quadrants. Soft  and nontender to palpation. No guarding or rigidity. No palpable masses. No distention.  Musculoskeletal: Full range of motion to all extremities. No gross deformities appreciated.  No tenderness to palpation of back.  Full range of motion of bilateral lower extremities.  Normal gait. Neurologic:  Normal speech and language. No gross focal neurologic deficits are appreciated.  Skin:  Skin is warm, dry and intact. No rash noted. Psychiatric: Mood and affect are normal. Speech and behavior are normal. Patient exhibits appropriate insight and judgement.   ____________________________________________   LABS (all labs ordered are listed, but only abnormal  results are displayed)  Labs Reviewed  URINALYSIS, COMPLETE (UACMP) WITH MICROSCOPIC - Abnormal; Notable for the following components:      Result Value   Color, Urine YELLOW (*)    APPearance CLEAR (*)    Ketones, ur 20 (*)    All other components within normal limits   ____________________________________________  EKG   ____________________________________________  RADIOLOGY Lexine BatonI, Nayla Dias, personally viewed and evaluated these images (plain radiographs) as part of my medical decision making, as well as reviewing the written report by the radiologist.  Dg Lumbar Spine 2-3 Views  Result Date: 08/21/2018 CLINICAL DATA:  Left-sided back pain. EXAM: LUMBAR SPINE - 2-3 VIEW COMPARISON:  CT 01/08/2018. FINDINGS: Diffuse multilevel degenerative change. Minimal anterior wedging of T12 and L1 noted. This is stable. This may be a normal variant. No acute abnormality identified. Pelvic calcifications consistent phleboliths. IMPRESSION: 1. Diffuse multilevel degenerative change. Minimal anterior wedging of T12 and L1. This is stable. No acute abnormality identified. 2.  Aortoiliac atherosclerotic vascular disease. Electronically Signed   By: Maisie Fushomas  Register   On: 08/21/2018 15:01   Dg Hip Unilat W Or Wo Pelvis 2-3 Views Left  Result Date: 08/21/2018 CLINICAL DATA:  Left hip pain, no known injury, initial encounter EXAM: DG HIP (WITH OR WITHOUT PELVIS) 3V LEFT COMPARISON:  None. FINDINGS: The pelvic ring is intact. Degenerative changes of the lumbar spine and hip joints are noted. No acute fracture or dislocation is seen. No soft tissue abnormality is noted. IMPRESSION: Mild degenerative change without acute abnormality. Electronically Signed   By: Alcide CleverMark  Lukens M.D.   On: 08/21/2018 15:01    ____________________________________________    PROCEDURES  Procedure(s) performed:    Procedures    Medications - No data to  display   ____________________________________________   INITIAL IMPRESSION / ASSESSMENT AND PLAN / ED COURSE  Pertinent labs & imaging results that were available during my care of the patient were reviewed by me and considered in my medical decision making (see chart for details).  Review of the [place] CSRS was performed in accordance of the [facility] prior to dispensing any controlled drugs.     Patient presented the emergency department for evaluation of left low back pain last night that resolved late this morning.  Vital signs and exam are reassuring.  Patient denies any pain while in the emergency department. Patient denies any associated symptoms in addition to the back pain.  Pain does not radiate.  No indication of nephrolithiasis or infection on urinalysis.  Pain seems lower than kidneies.  Lumbar x-ray and hip x-ray consistent with osteoarthritis and findings were discussed with patient.  Patient will be discharged home with prescriptions for Lidoderm. Patient is to follow up with PCP as directed. Patient is given ED precautions to return to the ED for any worsening or new symptoms.     ____________________________________________  FINAL CLINICAL IMPRESSION(S) / ED DIAGNOSES  Final diagnoses:  Acute left-sided low back pain without sciatica  Spondylosis of lumbar region without myelopathy or radiculopathy  Atherosclerosis      NEW MEDICATIONS STARTED DURING THIS VISIT:  ED Discharge Orders         Ordered    lidocaine (LIDODERM) 5 %  Every 24 hours     08/21/18 1535              This chart was dictated using voice recognition software/Dragon. Despite best efforts to proofread, errors can occur which can change the meaning. Any change was purely unintentional.    Enid Derry, PA-C 08/21/18 1600    Enid Derry, PA-C 08/21/18 1601    Governor Rooks, MD 08/21/18 [phone]

## 2018-08-21 NOTE — ED Triage Notes (Signed)
Patient reports left sided back pain that started last night. Denies any known injury. Patient denies any urinary symptoms or history of kidney stones.

## 2018-08-21 NOTE — ED Notes (Addendum)
See triage note. No specific mechanism of injury pt is aware of. Denies difficulty/pain on urination. Family at bedside.

## 2020-03-10 DIAGNOSIS — H2523 Age-related cataract, morgagnian type, bilateral: Secondary | ICD-10-CM | POA: Diagnosis not present

## 2020-03-15 ENCOUNTER — Other Ambulatory Visit: Payer: Self-pay

## 2020-03-15 ENCOUNTER — Other Ambulatory Visit (INDEPENDENT_AMBULATORY_CARE_PROVIDER_SITE_OTHER): Payer: Self-pay | Admitting: Vascular Surgery

## 2020-03-15 ENCOUNTER — Emergency Department: Payer: PPO

## 2020-03-15 ENCOUNTER — Encounter: Payer: Self-pay | Admitting: Emergency Medicine

## 2020-03-15 ENCOUNTER — Inpatient Hospital Stay
Admission: EM | Admit: 2020-03-15 | Discharge: 2020-03-18 | DRG: 271 | Disposition: A | Discharge status: Home / Self Care | Payer: PPO | Attending: Internal Medicine | Admitting: Internal Medicine

## 2020-03-15 DIAGNOSIS — M1990 Unspecified osteoarthritis, unspecified site: Secondary | ICD-10-CM | POA: Diagnosis present

## 2020-03-15 DIAGNOSIS — I824Z2 Acute embolism and thrombosis of unspecified deep veins of left distal lower extremity: Secondary | ICD-10-CM | POA: Diagnosis not present

## 2020-03-15 DIAGNOSIS — Z03818 Encounter for observation for suspected exposure to other biological agents ruled out: Secondary | ICD-10-CM | POA: Diagnosis not present

## 2020-03-15 DIAGNOSIS — I82432 Acute embolism and thrombosis of left popliteal vein: Secondary | ICD-10-CM | POA: Diagnosis not present

## 2020-03-15 DIAGNOSIS — Z20822 Contact with and (suspected) exposure to covid-19: Secondary | ICD-10-CM | POA: Diagnosis not present

## 2020-03-15 DIAGNOSIS — Z87891 Personal history of nicotine dependence: Secondary | ICD-10-CM | POA: Diagnosis not present

## 2020-03-15 DIAGNOSIS — Z79899 Other long term (current) drug therapy: Secondary | ICD-10-CM | POA: Diagnosis not present

## 2020-03-15 DIAGNOSIS — Z01818 Encounter for other preprocedural examination: Secondary | ICD-10-CM | POA: Diagnosis not present

## 2020-03-15 DIAGNOSIS — R451 Restlessness and agitation: Secondary | ICD-10-CM | POA: Diagnosis not present

## 2020-03-15 DIAGNOSIS — I491 Atrial premature depolarization: Secondary | ICD-10-CM | POA: Diagnosis not present

## 2020-03-15 DIAGNOSIS — N179 Acute kidney failure, unspecified: Secondary | ICD-10-CM | POA: Diagnosis not present

## 2020-03-15 DIAGNOSIS — I82409 Acute embolism and thrombosis of unspecified deep veins of unspecified lower extremity: Secondary | ICD-10-CM | POA: Diagnosis present

## 2020-03-15 DIAGNOSIS — I4510 Unspecified right bundle-branch block: Secondary | ICD-10-CM | POA: Diagnosis not present

## 2020-03-15 DIAGNOSIS — I82402 Acute embolism and thrombosis of unspecified deep veins of left lower extremity: Secondary | ICD-10-CM | POA: Diagnosis not present

## 2020-03-15 DIAGNOSIS — N182 Chronic kidney disease, stage 2 (mild): Secondary | ICD-10-CM | POA: Diagnosis present

## 2020-03-15 DIAGNOSIS — Z881 Allergy status to other antibiotic agents status: Secondary | ICD-10-CM | POA: Diagnosis not present

## 2020-03-15 DIAGNOSIS — I447 Left bundle-branch block, unspecified: Secondary | ICD-10-CM | POA: Diagnosis not present

## 2020-03-15 DIAGNOSIS — I82412 Acute embolism and thrombosis of left femoral vein: Principal | ICD-10-CM | POA: Diagnosis present

## 2020-03-15 DIAGNOSIS — I82442 Acute embolism and thrombosis of left tibial vein: Secondary | ICD-10-CM | POA: Diagnosis not present

## 2020-03-15 DIAGNOSIS — I82890 Acute embolism and thrombosis of other specified veins: Secondary | ICD-10-CM | POA: Diagnosis not present

## 2020-03-15 LAB — CBC WITH DIFFERENTIAL/PLATELET
Abs Immature Granulocytes: 0.04 10*3/uL (ref 0.00–0.07)
Basophils Absolute: 0 10*3/uL (ref 0.0–0.1)
Basophils Relative: 0 %
Eosinophils Absolute: 0.1 10*3/uL (ref 0.0–0.5)
Eosinophils Relative: 1 %
HCT: 45.1 % (ref 39.0–52.0)
Hemoglobin: 15.7 g/dL (ref 13.0–17.0)
Immature Granulocytes: 1 %
Lymphocytes Relative: 27 %
Lymphs Abs: 2.2 10*3/uL (ref 0.7–4.0)
MCH: 30 pg (ref 26.0–34.0)
MCHC: 34.8 g/dL (ref 30.0–36.0)
MCV: 86.2 fL (ref 80.0–100.0)
Monocytes Absolute: 0.8 10*3/uL (ref 0.1–1.0)
Monocytes Relative: 10 %
Neutro Abs: 4.9 10*3/uL (ref 1.7–7.7)
Neutrophils Relative %: 61 %
Platelets: 199 10*3/uL (ref 150–400)
RBC: 5.23 MIL/uL (ref 4.22–5.81)
RDW: 12.9 % (ref 11.5–15.5)
WBC: 8.1 10*3/uL (ref 4.0–10.5)
nRBC: 0 % (ref 0.0–0.2)

## 2020-03-15 LAB — COMPREHENSIVE METABOLIC PANEL
ALT: 16 U/L (ref 0–44)
AST: 19 U/L (ref 15–41)
Albumin: 4.2 g/dL (ref 3.5–5.0)
Alkaline Phosphatase: 62 U/L (ref 38–126)
Anion gap: 13 (ref 5–15)
BUN: 24 mg/dL — ABNORMAL HIGH (ref 8–23)
CO2: 24 mmol/L (ref 22–32)
Calcium: 9.6 mg/dL (ref 8.9–10.3)
Chloride: 104 mmol/L (ref 98–111)
Creatinine, Ser: 1.47 mg/dL — ABNORMAL HIGH (ref 0.61–1.24)
GFR calc Af Amer: 50 mL/min — ABNORMAL LOW (ref >60)
GFR calc non Af Amer: 43 mL/min — ABNORMAL LOW (ref >60)
Glucose, Bld: 100 mg/dL — ABNORMAL HIGH (ref 70–99)
Potassium: 3.7 mmol/L (ref 3.5–5.1)
Sodium: 141 mmol/L (ref 135–145)
Total Bilirubin: 0.7 mg/dL (ref 0.3–1.2)
Total Protein: 7.5 g/dL (ref 6.5–8.1)

## 2020-03-15 LAB — PROTIME-INR
INR: 1 (ref 0.8–1.2)
Prothrombin Time: 12.4 seconds (ref 11.4–15.2)

## 2020-03-15 LAB — SARS CORONAVIRUS 2 BY RT PCR (HOSPITAL ORDER, PERFORMED IN ~~LOC~~ HOSPITAL LAB): SARS Coronavirus 2: NEGATIVE

## 2020-03-15 LAB — APTT: aPTT: 28 seconds (ref 24–36)

## 2020-03-15 LAB — HEPARIN LEVEL (UNFRACTIONATED): Heparin Unfractionated: 0.93 IU/mL — ABNORMAL HIGH (ref 0.30–0.70)

## 2020-03-15 MED ORDER — TRAMADOL HCL 50 MG PO TABS
50.0000 mg | ORAL_TABLET | Freq: Three times a day (TID) | ORAL | Status: DC | PRN
  Administered 2020-03-15: 50 mg via ORAL
  Filled 2020-03-15: qty 1

## 2020-03-15 MED ORDER — ADULT MULTIVITAMIN W/MINERALS CH
1.0000 | ORAL_TABLET | Freq: Every day | ORAL | Status: DC
  Administered 2020-03-15 – 2020-03-18 (×3): 1 via ORAL
  Filled 2020-03-15 (×3): qty 1

## 2020-03-15 MED ORDER — SODIUM CHLORIDE 0.9 % IV SOLN: INTRAVENOUS | Status: DC

## 2020-03-15 MED ORDER — SODIUM CHLORIDE 0.9% FLUSH
3.0000 mL | Freq: Two times a day (BID) | INTRAVENOUS | Status: DC
  Administered 2020-03-16 – 2020-03-17 (×2): 3 mL via INTRAVENOUS

## 2020-03-15 MED ORDER — HYDROCODONE-ACETAMINOPHEN 5-325 MG PO TABS
1.0000 | ORAL_TABLET | Freq: Four times a day (QID) | ORAL | Status: DC | PRN
  Administered 2020-03-15 – 2020-03-18 (×2): 1 via ORAL
  Filled 2020-03-15 (×2): qty 1

## 2020-03-15 MED ORDER — ONDANSETRON HCL 4 MG PO TABS: 4.0000 mg | ORAL_TABLET | Freq: Four times a day (QID) | ORAL | Status: DC | PRN

## 2020-03-15 MED ORDER — HEPARIN (PORCINE) 25000 UT/250ML-% IV SOLN
750.0000 [IU]/h | INTRAVENOUS | Status: DC
  Administered 2020-03-15: 1100 [IU]/h via INTRAVENOUS
  Administered 2020-03-16 (×2): 750 [IU]/h via INTRAVENOUS
  Filled 2020-03-15 (×2): qty 250

## 2020-03-15 MED ORDER — HEPARIN BOLUS VIA INFUSION
3500.0000 [IU] | Freq: Once | INTRAVENOUS | Status: AC
  Administered 2020-03-15: 3500 [IU] via INTRAVENOUS
  Filled 2020-03-15: qty 3500

## 2020-03-15 MED ORDER — ONDANSETRON HCL 4 MG/2ML IJ SOLN: 4.0000 mg | Freq: Four times a day (QID) | INTRAMUSCULAR | Status: DC | PRN

## 2020-03-15 NOTE — Consult Note (Signed)
ANTICOAGULATION CONSULT NOTE  Pharmacy Consult for Heparin Indication: DVT  Allergies  Allergen Reactions  . Tetracyclines & Related Nausea And Vomiting    Flu symptoms    Patient Measurements: Height: 5\' 6"  (167.6 cm) Weight: 70.3 kg (155 lb) IBW/kg (Calculated) : 63.8 Heparin Dosing Weight: 70.3 kg  Vital Signs: Temp: 98.7 F (37.1 C) (06/29 0958) Temp Source: Oral (06/29 0958) BP: 144/63 (06/29 0958) Pulse Rate: 75 (06/29 0958)  Labs: Recent Labs    03/15/20 1208 03/15/20 1210  HGB 15.7  --   HCT 45.1  --   PLT 199  --   APTT  --  28    CrCl cannot be calculated (Patient's most recent lab result is older than the maximum 21 days allowed.).   Medical History: Past Medical History:  Diagnosis Date  . Arthritis     Medications:  (Not in a hospital admission)  Scheduled:  Infusions:  PRN:  Anti-infectives (From admission, onward)   None      Assessment: Pharmacy consulted to start heparin for DVT. No DOAC use noted PTA. CBC stable. Baseline labs ordered.   Goal of Therapy:  Heparin level 0.3-0.7 units/ml Monitor platelets by anticoagulation protocol: Yes   Plan:  Give 3500 units bolus x 1 Start heparin infusion at 1100 units/hr Check anti-Xa level in 8 hours and daily while on heparin Continue to monitor H&H and platelets  03/17/20, PharmD, BCPS 03/15/2020,12:37 PM

## 2020-03-15 NOTE — ED Notes (Signed)
Pt provided w/ lunch tray at bedside.  

## 2020-03-15 NOTE — ED Notes (Signed)
Assisted pt to use the restroom.

## 2020-03-15 NOTE — ED Notes (Signed)
Admitting provider at bedside.

## 2020-03-15 NOTE — Consult Note (Signed)
ANTICOAGULATION CONSULT NOTE  Pharmacy Consult for Heparin Indication: DVT  Allergies  Allergen Reactions  . Tetracyclines & Related Nausea And Vomiting    Flu symptoms    Patient Measurements: Height: 5\' 6"  (167.6 cm) Weight: 70.3 kg (155 lb) IBW/kg (Calculated) : 63.8 Heparin Dosing Weight: 70.3 kg  Vital Signs: Temp: 98.4 F (36.9 C) (06/29 1919) Temp Source: Oral (06/29 1600) BP: 132/72 (06/29 1919) Pulse Rate: 64 (06/29 1919)  Labs: Recent Labs    03/15/20 1208 03/15/20 1210 03/15/20 2031  HGB 15.7  --   --   HCT 45.1  --   --   PLT 199  --   --   APTT  --  28  --   LABPROT 12.4  --   --   INR 1.0  --   --   HEPARINUNFRC  --   --  0.93*  CREATININE 1.47*  --   --     Estimated Creatinine Clearance: 34.4 mL/min (A) (by C-G formula based on SCr of 1.47 mg/dL (H)).   Medical History: Past Medical History:  Diagnosis Date  . Arthritis     Medications:  Medications Prior to Admission  Medication Sig Dispense Refill Last Dose  . Multiple Vitamin (MULTIVITAMIN WITH MINERALS) TABS tablet Take 1 tablet by mouth daily.   03/14/2020 at Unknown time   Scheduled:  Infusions:  PRN:  Anti-infectives (From admission, onward)   None      Assessment: Pharmacy consulted to start heparin for DVT. No DOAC use noted PTA. CBC stable. Baseline labs ordered.   Goal of Therapy:  Heparin level 0.3-0.7 units/ml Monitor platelets by anticoagulation protocol: Yes   Plan:  06/29 @ 2230 HL 0.93 supratherapeutic. Will reduce rate to 900 units/hr and will recheck HL at 0600, CBC check w/ am labs and will continue to monitor.  7/29, PharmD, BCPS 03/15/2020,10:38 PM

## 2020-03-15 NOTE — ED Provider Notes (Signed)
[facility] Emergency Department Provider Note    First MD Initiated Contact with Patient 03/15/20 1145     (approximate)  I have reviewed the triage vital signs and the nursing notes.   HISTORY  Chief Complaint Leg Pain and Leg Swelling    HPI GOVANNI PLEMONS Bryan Garza is a 86 y.o. male with no significant past medical history presents to the ER for several weeks of worsening achiness in his left leg as well as swelling.  Went to follow-up with his primary doctor but was sent to the ER for evaluation.  Denies any fevers.  No recent antibiotics.  Is not currently on any blood thinners.  He does not smoke.  Denies any recent surgeries.  States that discomfort is achy in nature.    Past Medical History:  Diagnosis Date  . Arthritis    No family history on file. Past Surgical History:  Procedure Laterality Date  . MINOR HEMORRHOIDECTOMY     There are no problems to display for this patient.     Prior to Admission medications   Medication Sig Start Date End Date Taking? Authorizing Provider  Multiple Vitamin (MULTIVITAMIN WITH MINERALS) TABS tablet Take 1 tablet by mouth daily.   Yes [provider]    Allergies Tetracyclines & related    Social History Social History   Tobacco Use  . Smoking status: Former Games developer  . Smokeless tobacco: Never Used  Substance Use Topics  . Alcohol use: No  . Drug use: Never    Review of Systems Patient denies headaches, rhinorrhea, blurry vision, numbness, shortness of breath, chest pain, edema, cough, abdominal pain, nausea, vomiting, diarrhea, dysuria, fevers, rashes or hallucinations unless otherwise stated above in HPI. ____________________________________________   PHYSICAL EXAM:  VITAL SIGNS: Vitals:   03/15/20 0958 03/15/20 1230  BP: (!) 144/63 140/83  Pulse: 75 68  Resp: 17 (!) 25  Temp: 98.7 F (37.1 C)   SpO2: 98% 98%    Constitutional: Alert and oriented.  Eyes: Conjunctivae  are normal.  Head: Atraumatic. Nose: No congestion/rhinnorhea. Mouth/Throat: Mucous membranes are moist.   Neck: No stridor. Painless ROM.  Cardiovascular: Normal rate, regular rhythm. Grossly normal heart sounds.  Good peripheral circulation. Respiratory: Normal respiratory effort.  No retractions. Lungs CTAB. Gastrointestinal: Soft and nontender. No distention. No abdominal bruits. No CVA tenderness. Genitourinary:  Musculoskeletal: No lower extremity tenderness , 1+ LLE edema.  NV intact distally.  No joint effusions. Neurologic:  Normal speech and language. No gross focal neurologic deficits are appreciated. No facial droop Skin:  Skin is warm, dry and intact. No rash noted. Psychiatric: Mood and affect are normal. Speech and behavior are normal.  ____________________________________________   LABS (all labs ordered are listed, but only abnormal results are displayed)  Results for orders placed or performed during the hospital encounter of 03/15/20 (from the past 24 hour(s))  CBC with Differential/Platelet     Status: None   Collection Time: 03/15/20 12:08 PM  Result Value Ref Range   WBC 8.1 4.0 - 10.5 K/uL   RBC 5.23 4.22 - 5.81 MIL/uL   Hemoglobin 15.7 13.0 - 17.0 g/dL   HCT 63.8 39 - 52 %   MCV 86.2 80.0 - 100.0 fL   MCH 30.0 26.0 - 34.0 pg   MCHC 34.8 30.0 - 36.0 g/dL   RDW 75.6 43.3 - 29.5 %   Platelets 199 150 - 400 K/uL   nRBC 0.0 0.0 - 0.2 %   Neutrophils  Relative % 61 %   Neutro Abs 4.9 1.7 - 7.7 K/uL   Lymphocytes Relative 27 %   Lymphs Abs 2.2 0.7 - 4.0 K/uL   Monocytes Relative 10 %   Monocytes Absolute 0.8 0 - 1 K/uL   Eosinophils Relative 1 %   Eosinophils Absolute 0.1 0 - 0 K/uL   Basophils Relative 0 %   Basophils Absolute 0.0 0 - 0 K/uL   Immature Granulocytes 1 %   Abs Immature Granulocytes 0.04 0.00 - 0.07 K/uL  Comprehensive metabolic panel     Status: Abnormal   Collection Time: 03/15/20 12:08 PM  Result Value Ref Range   Sodium 141 135 - 145  mmol/L   Potassium 3.7 3.5 - 5.1 mmol/L   Chloride 104 98 - 111 mmol/L   CO2 24 22 - 32 mmol/L   Glucose, Bld 100 (H) 70 - 99 mg/dL   BUN 24 (H) 8 - 23 mg/dL   Creatinine, Ser 8.56 (H) 0.61 - 1.24 mg/dL   Calcium 9.6 8.9 - 31.4 mg/dL   Total Protein 7.5 6.5 - 8.1 g/dL   Albumin 4.2 3.5 - 5.0 g/dL   AST 19 15 - 41 U/L   ALT 16 0 - 44 U/L   Alkaline Phosphatase 62 38 - 126 U/L   Total Bilirubin 0.7 0.3 - 1.2 mg/dL   GFR calc non Af Amer 43 (L) >60 mL/min   GFR calc Af Amer 50 (L) >60 mL/min   Anion gap 13 5 - 15  Protime-INR     Status: None   Collection Time: 03/15/20 12:08 PM  Result Value Ref Range   Prothrombin Time 12.4 11.4 - 15.2 seconds   INR 1.0 0.8 - 1.2  APTT     Status: None   Collection Time: 03/15/20 12:10 PM  Result Value Ref Range   aPTT 28 24 - 36 seconds   ____________________________________________  EKG My review and personal interpretation at Time: 13:04   Indication: dvt  Rate: 60  Rhythm: sinus Axis: left Other: iRBBB, no stemi or depressions ____________________________________________  RADIOLOGY  I personally reviewed all radiographic images ordered to evaluate for the above acute complaints and reviewed radiology reports and findings.  These findings were personally discussed with the patient.  Please see medical record for radiology report.  ____________________________________________   PROCEDURES  Procedure(s) performed:  .Critical Care Performed by: Willy Eddy, MD Authorized by: Willy Eddy, MD   Critical care provider statement:    Critical care time (minutes):  35   Critical care time was exclusive of:  Separately billable procedures and treating other patients   Critical care was necessary to treat or prevent imminent or life-threatening deterioration of the following conditions:  Circulatory failure   Critical care was time spent personally by me on the following activities:  Development of treatment plan with patient  or surrogate, discussions with consultants, evaluation of patient's response to treatment, examination of patient, obtaining history from patient or surrogate, ordering and performing treatments and interventions, ordering and review of laboratory studies, ordering and review of radiographic studies, pulse oximetry, re-evaluation of patient's condition and review of old charts      Critical Care performed: yes ____________________________________________   INITIAL IMPRESSION / ASSESSMENT AND PLAN / ED COURSE  Pertinent labs & imaging results that were available during my care of the patient were reviewed by me and considered in my medical decision making (see chart for details).   DDX: dvt, claudication, contusion, cellulitis  SENG LARCH Bryan Garza is a 86 y.o. who presents to the ED with symptoms as described above with work-up showing evidence of acute occlusive extensive left lower extremity DVT.  This is an unprovoked DVT based on history.  No contraindications to heparin.  I will place on IV heparin drip.  I consulted with vascular surgery who agrees would be candidate for thrombectomy.  Patient will be admitted to the hospitalist for medical management and further evaluation by vascular surgery.  Have discussed with the patient and available family all diagnostics and treatments performed thus far and all questions were answered to the best of my ability. The patient demonstrates understanding and agreement with plan.      The patient was evaluated in Emergency Department today for the symptoms described in the history of present illness. He/she was evaluated in the context of the global COVID-19 pandemic, which necessitated consideration that the patient might be at risk for infection with the SARS-CoV-2 virus that causes COVID-19. Institutional protocols and algorithms that pertain to the evaluation of patients at risk for COVID-19 are in a state of rapid change based on information  released by regulatory bodies including the CDC and federal and state organizations. These policies and algorithms were followed during the patient's care in the ED.  As part of my medical decision making, I reviewed the following data within the electronic MEDICAL RECORD NUMBER Nursing notes reviewed and incorporated, Labs reviewed, notes from prior ED visits and [place] Controlled Substance Database   ____________________________________________   FINAL CLINICAL IMPRESSION(S) / ED DIAGNOSES  Final diagnoses:  DVT of deep femoral vein, left (HCC)      NEW MEDICATIONS STARTED DURING THIS VISIT:  New Prescriptions   No medications on file     Note:  This document was prepared using Dragon voice recognition software and may include unintentional dictation errors.    Willy Eddy, MD 03/15/20 1306

## 2020-03-15 NOTE — Consult Note (Signed)
[facility] Vascular Consult Note  MRN : [id number]  Bryan Garza Bryan Garza is a 86 y.o. ([birth year]) male who presents with chief complaint of  Chief Complaint  Patient presents with  . Leg Pain  . Leg Swelling   History of Present Illness:  The patient is an 86 year old male with multiple medical issues see below who presented to the [facility] emergency department seeking medical attention due to progressively worsening left lower extremity pain and swelling.  Patient endorses a history of progressively worsening left lower extremity swelling and pain.  Patient notes his symptoms have been present for at least 2 weeks.  He denies any recent surgery, trauma, prolonged immobility, clotting/bleeding disorders or past DVT/PE.  Patient was admitted and seen by his primary care physician who recommended he seek medical attention in our emergency department.  Patient denies any shortness of breath or chest pain.  Patient denies any fever, nausea vomiting.  Patient is not on any blood thinner medication.  Vascular surgery was consulted by Dr. Joylene Igo for possible endovascular intervention.  Current Facility-Administered Medications  Medication Dose Route Frequency Provider Last Rate Last Admin  . heparin ADULT infusion 100 units/mL (25000 units/288mL sodium chloride 0.45%)  1,100 Units/hr Intravenous Continuous Ronnald Ramp, RPH 11 mL/hr at 03/15/20 1258 1,100 Units/hr at 03/15/20 1258  . multivitamin with minerals tablet 1 tablet  1 tablet Oral Daily Agbata, Tochukwu, MD   1 tablet at 03/15/20 1407  . ondansetron (ZOFRAN) tablet 4 mg  4 mg Oral Q6H PRN Agbata, Tochukwu, MD       Or  . ondansetron (ZOFRAN) injection 4 mg  4 mg Intravenous Q6H PRN Agbata, Tochukwu, MD      . sodium chloride flush (NS) 0.9 % injection 3 mL  3 mL Intravenous Q12H Agbata, Tochukwu, MD      . traMADol (ULTRAM) tablet 50 mg  50 mg Oral Q8H PRN Agbata, Tochukwu, MD        Current Outpatient Medications  Medication Sig Dispense Refill  . Multiple Vitamin (MULTIVITAMIN WITH MINERALS) TABS tablet Take 1 tablet by mouth daily.     Past Medical History:  Diagnosis Date  . Arthritis    Past Surgical History:  Procedure Laterality Date  . MINOR HEMORRHOIDECTOMY     Social History Social History   Tobacco Use  . Smoking status: Former Games developer  . Smokeless tobacco: Never Used  Substance Use Topics  . Alcohol use: No  . Drug use: Never   Family History Denies family history of peripheral artery disease, venous disease or renal/bleeding disorders.  Allergies  Allergen Reactions  . Tetracyclines & Related Nausea And Vomiting    Flu symptoms   REVIEW OF SYSTEMS (Negative unless checked)  Constitutional: [] Weight loss  [] Fever  [] Chills Cardiac: [] Chest pain   [] Chest pressure   [] Palpitations   [] Shortness of breath when laying flat   [] Shortness of breath at rest   [] Shortness of breath with exertion. Vascular:  [x] Pain in legs with walking   [x] Pain in legs at rest   [x] Pain in legs when laying flat   [] Claudication   [] Pain in feet when walking  [] Pain in feet at rest  [] Pain in feet when laying flat   [] History of DVT   [] Phlebitis   [x] Swelling in legs   [] Varicose veins   [] Non-healing ulcers Pulmonary:   [] Uses home oxygen   [] Productive cough   [] Hemoptysis   [] Wheeze  [] COPD   [] Asthma  Neurologic:  [] Dizziness  [] Blackouts   [] Seizures   [] History of stroke   [] History of TIA  [] Aphasia   [] Temporary blindness   [] Dysphagia   [] Weakness or numbness in arms   [] Weakness or numbness in legs Musculoskeletal:  [] Arthritis   [] Joint swelling   [] Joint pain   [] Low back pain Hematologic:  [] Easy bruising  [] Easy bleeding   [] Hypercoagulable state   [] Anemic  [] Hepatitis Gastrointestinal:  [] Blood in stool   [] Vomiting blood  [] Gastroesophageal reflux/heartburn   [] Difficulty swallowing. Genitourinary:  [] Chronic kidney disease   [] Difficult urination   [] Frequent urination  [] Burning with urination   [] Blood in urine Skin:  [] Rashes   [] Ulcers   [] Wounds Psychological:  [] History of anxiety   []  History of major depression.  Physical Examination  Vitals:   03/15/20 1230 03/15/20 1300 03/15/20 1400 03/15/20 1430  BP: 140/83 137/68 137/73 (!) 153/75  Pulse: 68 (!) 59 62 69  Resp: (!) 25 17 (!) 23 20  Temp:      TempSrc:      SpO2: 98% 98% 100% 100%  Weight:      Height:       Body mass index is 25.02 kg/m. Gen:  WD/WN, NAD Head: [place]/AT, No temporalis wasting. Prominent temp pulse not noted. Ear/Nose/Throat: Hearing grossly intact, nares w/o erythema or drainage, oropharynx w/o Erythema/Exudate Eyes: Sclera non-icteric, conjunctiva clear Neck: Trachea midline.  No JVD.  Pulmonary:  Good air movement, respirations not labored, equal bilaterally.  Cardiac: RRR, normal S1, S2. Vascular:  Vessel Right Left  Radial Palpable Palpable  Ulnar Palpable Palpable  Brachial Palpable Palpable  Carotid Palpable, without bruit Palpable, without bruit  Aorta Not palpable N/A  Femoral Palpable Palpable  Popliteal Palpable Palpable  PT Palpable Palpable  DP Palpable Palpable   Left lower extremity: Thigh soft.  Calf soft.  Extremities warm distally toes.  Minimal edema.  There is no acute vascular compromise to the leg at this time.  Motor/sensory is intact.  Gastrointestinal: soft, non-tender/non-distended. No guarding/reflex.  Musculoskeletal: M/S 5/5 throughout.  Extremities without ischemic changes.  No deformity or atrophy. No edema. Neurologic: Sensation grossly intact in extremities.  Symmetrical.  Speech is fluent. Motor exam as listed above. Psychiatric: Judgment intact, Mood & affect appropriate for pt's clinical situation. Dermatologic: No rashes or ulcers noted.  No cellulitis or open wounds. Lymph : No Cervical, Axillary, or Inguinal lymphadenopathy.  CBC Lab Results  Component Value Date   WBC 8.1 03/15/2020   HGB 15.7  03/15/2020   HCT 45.1 03/15/2020   MCV 86.2 03/15/2020   PLT 199 03/15/2020   BMET    Component Value Date/Time   NA 141 03/15/2020 1208   K 3.7 03/15/2020 1208   CL 104 03/15/2020 1208   CO2 24 03/15/2020 1208   GLUCOSE 100 (H) 03/15/2020 1208   BUN 24 (H) 03/15/2020 1208   CREATININE 1.47 (H) 03/15/2020 1208   CALCIUM 9.6 03/15/2020 1208   GFRNONAA 43 (L) 03/15/2020 1208   GFRAA 50 (L) 03/15/2020 1208   Estimated Creatinine Clearance: 34.4 mL/min (A) (by C-G formula based on SCr of 1.47 mg/dL (H)).  COAG Lab Results  Component Value Date   INR 1.0 03/15/2020   Radiology Venous Img Lower Unilateral Left  Result Date: 03/15/2020 CLINICAL DATA:  Left leg swelling pain EXAM: LEFT LOWER EXTREMITY VENOUS DOPPLER ULTRASOUND TECHNIQUE: Gray-scale sonography with graded compression, as well as color Doppler and duplex ultrasound were performed to evaluate the lower extremity  deep venous systems from the level of the common femoral vein and including the common femoral, femoral, profunda femoral, popliteal and calf veins including the posterior tibial, peroneal and gastrocnemius veins when visible. The superficial great saphenous vein was also interrogated. Spectral Doppler was utilized to evaluate flow at rest and with distal augmentation maneuvers in the common femoral, femoral and popliteal veins. COMPARISON:  None. FINDINGS: Contralateral Common Femoral Vein: Respiratory phasicity is normal and symmetric with the symptomatic side. No evidence of thrombus. Normal compressibility. Common Femoral Vein: Hypoechoic intraluminal thrombus. Vessel is partially compressible. Thrombus is nonocclusive. Saphenofemoral Junction: No evidence of thrombus. Normal compressibility and flow on color Doppler imaging. Profunda Femoral Vein: Hypoechoic intraluminal thrombus. Vessel is partially compressible. Thrombus is nonocclusive. Femoral Vein: Diffuse hypoechoic thrombus throughout. Vessel is  noncompressible. Thrombus is occlusive Popliteal Vein: Similar hypoechoic thrombus throughout. Vessel is noncompressible. Thrombus is occlusive Calf Veins: Occlusive thrombus extends into the calf tibial and peroneal veins. IMPRESSION: Positive exam for extensive left lower extremity femoropopliteal acute occlusive DVT extending into the calf veins. Electronically Signed   By: Judie Petit.  Shick M.D.   On: 03/15/2020 11:02   DG Chest Portable 1 View  Result Date: 03/15/2020 CLINICAL DATA:  Preoperative assessment EXAM: PORTABLE CHEST 1 VIEW COMPARISON:  None. FINDINGS: Lungs are clear. Heart size and pulmonary vascularity are normal. No adenopathy. There is upper thoracic levoscoliosis. IMPRESSION: No edema or airspace opacity.  Cardiac silhouette normal. Electronically Signed   By: Bretta Bang III M.D.   On: 03/15/2020 12:55   Assessment/Plan The patient is an 86 year old male with multiple medical issues see below who presented to the [facility] emergency department seeking medical attention due to progressively worsening left lower extremity pain and swelling.  1.  Left lower extremity DVT: Patient presents to the [facility] emergency department with a chief complaint of progressively worsening left lower extremity pain and swelling.  Symptoms have been present for last 2 weeks.  Venous duplex 02/17/20: Positive exam for extensive left lower extremity femoropopliteal acute occlusive DVT extending into the calf veins.  Patient presents with a unprovoked DVT.  Due to the extent the size of the patient's DVT, from the femoral-popliteal vein distally into the calf veins and its occlusive nature recommend the patient undergo a left lower extremity venous thrombolysis and thrombectomy in an attempt to lessen the clot burden, the patient's symptoms and decreased post phlebitic sequela.  Agree with admission initiation of heparin.  Procedure, risk and benefits  were explained to the patient.  All questions were answered.  The patient wished to proceed.  The patient's daughter, who was in the in attendance during this consult also agrees to proceed.  The patient understands and we will continue to monitor his DVT in the outpatient setting.  Patient understands that he will be on oral blood thinners for approximately 6 months.  2.  Chronic kidney disease: Patient's Cotton this morning was 1.47.  Last creatinine in our computer system on January 08, 2018 was 1.15.  Will gently hydrate overnight.  Patient understands it would be using contrast dye which could potentially worsen his kidney function.  We will try to minimize the contrast use.  The patient and his daughter who was in attendance during this consult agree.  3.  Arthritis: Asymptomatic at this time. Over-the-counter pain medications seem to work well. Followed by his primary care physician.  Discussed with Dr. Weldon Inches, PA-C  03/15/2020 3:28 PM  This note was created with Dragon medical transcription system.  Any error is purely unintentional

## 2020-03-15 NOTE — ED Triage Notes (Signed)
Pt reports pain behind his left knee and swelling to his left leg for the past month. Pt went to MD and was sent to the ED.

## 2020-03-15 NOTE — H&P (Signed)
History and Physical    Bryan Garza GYK:[id number] DOB: [birth year] DOA: 03/15/2020  PCP: Patient, No Pcp Per   Patient coming from: Home  I have personally briefly reviewed patient's old medical records in [facility]  Chief Complaint: Left leg pain and swelling  HPI: Bryan Garza is a 86 y.o. male with medical history significant for arthritis.  He presents to the emergency room for evaluation of a 1 month history of left leg pain and swelling.  He describes it as a constant discomfort in his left lower extremity.  He denies any trauma, denies having any fever or chills, denies any recent surgery or hospitalization.  He denies any weight loss, no loss of appetite and no night sweats. Left leg venous Doppler extensive left lower extremity femoropopliteal acute occlusive DVT extending into the calf veins. Twelve-lead EKG shows sinus rhythm with an incomplete left bundle branch block and premature atrial complexes   ED Course: Patient is an 86 year old male who presents to the ER for evaluation of left leg pain and swelling and is found to have extensive left lower extremity femoral-popliteal acute occlusive DVT extending into the calf veins.  Patient will be admitted to the hospital for further evaluation and vascular surgery has been consulted.    Review of Systems: As per HPI otherwise 10 point review of systems negative.    Past Medical History:  Diagnosis Date  . Arthritis     Past Surgical History:  Procedure Laterality Date  . MINOR HEMORRHOIDECTOMY       reports that he has quit smoking. He has never used smokeless tobacco. He reports that he does not drink alcohol and does not use drugs.  Allergies  Allergen Reactions  . Tetracyclines & Related Nausea And Vomiting    Flu symptoms    No family history on file.   Prior to Admission medications   Medication Sig Start Date End Date Taking? Authorizing Provider  Multiple Vitamin (MULTIVITAMIN WITH  MINERALS) TABS tablet Take 1 tablet by mouth daily.   Yes [provider]    Physical Exam: Vitals:   03/15/20 0957 03/15/20 0958 03/15/20 1230  BP:  (!) 144/63 140/83  Pulse:  75 68  Resp:  17 (!) 25  Temp:  98.7 F (37.1 C)   TempSrc:  Oral   SpO2:  98% 98%  Weight: 70.3 kg    Height: 5\' 6"  (1.676 m)       Vitals:   03/15/20 0957 03/15/20 0958 03/15/20 1230  BP:  (!) 144/63 140/83  Pulse:  75 68  Resp:  17 (!) 25  Temp:  98.7 F (37.1 C)   TempSrc:  Oral   SpO2:  98% 98%  Weight: 70.3 kg    Height: 5\' 6"  (1.676 m)      Constitutional: NAD, alert and oriented x 3 Eyes: PERRL, lids and conjunctivae normal ENMT: Mucous membranes are moist.  Neck: normal, supple, no masses, no thyromegaly Respiratory: clear to auscultation bilaterally, no wheezing, no crackles. Normal respiratory effort. No accessory muscle use.  Cardiovascular: Regular rate and rhythm, no murmurs / rubs / gallops. 1+ extremity edema involving the left leg. 2+ pedal pulses. No carotid bruits.  Abdomen: no tenderness, no masses palpated. No hepatosplenomegaly. Bowel sounds positive.  Musculoskeletal: no clubbing / cyanosis. No joint deformity upper and lower extremities.  Skin: no rashes, lesions, ulcers.  Neurologic: No gross focal neurologic deficit. Psychiatric: Normal mood and affect.   Labs on  Admission: I have personally reviewed following labs and imaging studies  CBC: Recent Labs  Lab 03/15/20 1208  WBC 8.1  NEUTROABS 4.9  HGB 15.7  HCT 45.1  MCV 86.2  PLT 199   Basic Metabolic Panel: Recent Labs  Lab 03/15/20 1208  NA 141  K 3.7  CL 104  CO2 24  GLUCOSE 100*  BUN 24*  CREATININE 1.47*  CALCIUM 9.6   GFR: Estimated Creatinine Clearance: 34.4 mL/min (A) (by C-G formula based on SCr of 1.47 mg/dL (H)). Liver Function Tests: Recent Labs  Lab 03/15/20 1208  AST 19  ALT 16  ALKPHOS 62  BILITOT 0.7  PROT 7.5  ALBUMIN 4.2   No results for input(s): LIPASE,  AMYLASE in the last 168 hours. No results for input(s): AMMONIA in the last 168 hours. Coagulation Profile: Recent Labs  Lab 03/15/20 1208  INR 1.0   Cardiac Enzymes: No results for input(s): CKTOTAL, CKMB, CKMBINDEX, TROPONINI in the last 168 hours. BNP (last 3 results) No results for input(s): PROBNP in the last 8760 hours. HbA1C: No results for input(s): HGBA1C in the last 72 hours. CBG: No results for input(s): GLUCAP in the last 168 hours. Lipid Profile: No results for input(s): CHOL, HDL, LDLCALC, TRIG, CHOLHDL, LDLDIRECT in the last 72 hours. Thyroid Function Tests: No results for input(s): TSH, T4TOTAL, FREET4, T3FREE, THYROIDAB in the last 72 hours. Anemia Panel: No results for input(s): VITAMINB12, FOLATE, FERRITIN, TIBC, IRON, RETICCTPCT in the last 72 hours. Urine analysis:    Component Value Date/Time   COLORURINE YELLOW (A) 08/21/2018 1420   APPEARANCEUR CLEAR (A) 08/21/2018 1420   LABSPEC 1.021 08/21/2018 1420   PHURINE 5.0 08/21/2018 1420   GLUCOSEU NEGATIVE 08/21/2018 1420   HGBUR NEGATIVE 08/21/2018 1420   BILIRUBINUR NEGATIVE 08/21/2018 1420   KETONESUR 20 (A) 08/21/2018 1420   PROTEINUR NEGATIVE 08/21/2018 1420   NITRITE NEGATIVE 08/21/2018 1420   LEUKOCYTESUR NEGATIVE 08/21/2018 1420    Radiological Exams on Admission: US Venous Img Lower Unilateral Left  Result Date: 03/15/2020 CLINICAL DATA:  Left leg swelling pain EXAM: LEFT LOWER EXTREMITY VENOUS DOPPLER ULTRASOUND TECHNIQUE: Gray-scale sonography with graded compression, as well as color Doppler and duplex ultrasound were performed to evaluate the lower extremity deep venous systems from the level of the common femoral vein and including the common femoral, femoral, profunda femoral, popliteal and calf veins including the posterior tibial, peroneal and gastrocnemius veins when visible. The superficial great saphenous vein was also interrogated. Spectral Doppler was utilized to evaluate flow at rest  and with distal augmentation maneuvers in the common femoral, femoral and popliteal veins. COMPARISON:  None. FINDINGS: Contralateral Common Femoral Vein: Respiratory phasicity is normal and symmetric with the symptomatic side. No evidence of thrombus. Normal compressibility. Common Femoral Vein: Hypoechoic intraluminal thrombus. Vessel is partially compressible. Thrombus is nonocclusive. Saphenofemoral Junction: No evidence of thrombus. Normal compressibility and flow on color Doppler imaging. Profunda Femoral Vein: Hypoechoic intraluminal thrombus. Vessel is partially compressible. Thrombus is nonocclusive. Femoral Vein: Diffuse hypoechoic thrombus throughout. Vessel is noncompressible. Thrombus is occlusive Popliteal Vein: Similar hypoechoic thrombus throughout. Vessel is noncompressible. Thrombus is occlusive Calf Veins: Occlusive thrombus extends into the calf tibial and peroneal veins. IMPRESSION: Positive exam for extensive left lower extremity femoropopliteal acute occlusive DVT extending into the calf veins. Electronically Signed   By: Judie Petit.  Shick M.D.   On: 03/15/2020 11:02   DG Chest Portable 1 View  Result Date: 03/15/2020 CLINICAL DATA:  Preoperative assessment EXAM: PORTABLE CHEST 1  VIEW COMPARISON:  None. FINDINGS: Lungs are clear. Heart size and pulmonary vascularity are normal. No adenopathy. There is upper thoracic levoscoliosis. IMPRESSION: No edema or airspace opacity.  Cardiac silhouette normal. Electronically Signed   By: Bretta Bang III M.D.   On: 03/15/2020 12:55    EKG: Independently reviewed.  Sinus rhythm Premature atrial complexes Incomplete right bundle branch block  Assessment/Plan Principal Problem:   DVT (deep venous thrombosis) (HCC)    Left leg DVT Patient presents for evaluation of left leg pain and swelling. Venous Doppler shows extensive left lower extremity femoropopliteal acute occlusive DVT extending into the calf veins. Patient is currently on heparin  drip Vascular surgery consult for possible thrombectomy   DVT prophylaxis: Heparin Code Status: Full code Family Communication: Greater than 50% of time was spent discussing plan of care with patient and his daughter at the bedside.  All questions and concerns have been addressed.  They verbalized understanding and agreed with the plan. Disposition Plan: Back to previous home environment Consults called: Vascular surgery     Gualberto Wahlen MD [facility]     03/15/2020, 1:30 PM

## 2020-03-16 ENCOUNTER — Encounter: Payer: Self-pay | Admitting: Internal Medicine

## 2020-03-16 ENCOUNTER — Encounter
Admission: EM | Disposition: A | Discharge status: Home / Self Care | Payer: Self-pay | Source: Home / Self Care | Attending: Internal Medicine

## 2020-03-16 ENCOUNTER — Encounter (INDEPENDENT_AMBULATORY_CARE_PROVIDER_SITE_OTHER): Payer: Self-pay | Admitting: Ophthalmology

## 2020-03-16 DIAGNOSIS — I82402 Acute embolism and thrombosis of unspecified deep veins of left lower extremity: Secondary | ICD-10-CM | POA: Diagnosis not present

## 2020-03-16 HISTORY — PX: PERIPHERAL VASCULAR THROMBECTOMY: CATH118306

## 2020-03-16 LAB — CBC
HCT: 38.5 % — ABNORMAL LOW (ref 39.0–52.0)
Hemoglobin: 13.2 g/dL (ref 13.0–17.0)
MCH: 30.3 pg (ref 26.0–34.0)
MCHC: 34.3 g/dL (ref 30.0–36.0)
MCV: 88.3 fL (ref 80.0–100.0)
Platelets: 183 10*3/uL (ref 150–400)
RBC: 4.36 MIL/uL (ref 4.22–5.81)
RDW: 12.7 % (ref 11.5–15.5)
WBC: 5.8 10*3/uL (ref 4.0–10.5)
nRBC: 0 % (ref 0.0–0.2)

## 2020-03-16 LAB — BASIC METABOLIC PANEL
Anion gap: 7 (ref 5–15)
BUN: 21 mg/dL (ref 8–23)
CO2: 27 mmol/L (ref 22–32)
Calcium: 8.8 mg/dL — ABNORMAL LOW (ref 8.9–10.3)
Chloride: 107 mmol/L (ref 98–111)
Creatinine, Ser: 1.34 mg/dL — ABNORMAL HIGH (ref 0.61–1.24)
GFR calc Af Amer: 56 mL/min — ABNORMAL LOW (ref >60)
GFR calc non Af Amer: 49 mL/min — ABNORMAL LOW (ref >60)
Glucose, Bld: 102 mg/dL — ABNORMAL HIGH (ref 70–99)
Potassium: 4 mmol/L (ref 3.5–5.1)
Sodium: 141 mmol/L (ref 135–145)

## 2020-03-16 LAB — HEPARIN LEVEL (UNFRACTIONATED)
Heparin Unfractionated: 0.85 IU/mL — ABNORMAL HIGH (ref 0.30–0.70)
Heparin Unfractionated: 0.49 IU/mL (ref 0.30–0.70)
Heparin Unfractionated: 0.37 IU/mL (ref 0.30–0.70)

## 2020-03-16 LAB — MAGNESIUM: Magnesium: 2.1 mg/dL (ref 1.7–2.4)

## 2020-03-16 SURGERY — PERIPHERAL VASCULAR THROMBECTOMY
Anesthesia: Moderate Sedation | Laterality: Left

## 2020-03-16 MED ORDER — MIDAZOLAM HCL 5 MG/5ML IJ SOLN
INTRAMUSCULAR | Status: AC
  Filled 2020-03-16: qty 5

## 2020-03-16 MED ORDER — MIDAZOLAM HCL 2 MG/2ML IJ SOLN
INTRAMUSCULAR | Status: DC | PRN
  Administered 2020-03-16: 2 mg via INTRAVENOUS

## 2020-03-16 MED ORDER — HEPARIN SODIUM (PORCINE) 1000 UNIT/ML IJ SOLN
INTRAMUSCULAR | Status: AC
  Filled 2020-03-16: qty 1

## 2020-03-16 MED ORDER — ALTEPLASE 2 MG IJ SOLR
INTRAMUSCULAR | Status: DC | PRN
  Administered 2020-03-16: 8 mg

## 2020-03-16 MED ORDER — MORPHINE SULFATE (PF) 2 MG/ML IV SOLN
2.0000 mg | Freq: Once | INTRAVENOUS | Status: AC
  Administered 2020-03-16: 05:00:00 2 mg via INTRAVENOUS
  Filled 2020-03-16: qty 1

## 2020-03-16 MED ORDER — ALTEPLASE 2 MG IJ SOLR
INTRAMUSCULAR | Status: AC
  Filled 2020-03-16: qty 8

## 2020-03-16 MED ORDER — HEPARIN SODIUM (PORCINE) 1000 UNIT/ML IJ SOLN
INTRAMUSCULAR | Status: DC | PRN
  Administered 2020-03-16: 3000 [IU] via INTRAVENOUS

## 2020-03-16 MED ORDER — SODIUM CHLORIDE 0.9 % IV SOLN: INTRAVENOUS | Status: DC

## 2020-03-16 MED ORDER — MORPHINE SULFATE (PF) 2 MG/ML IV SOLN
1.0000 mg | INTRAVENOUS | Status: DC | PRN
  Administered 2020-03-16: 1 mg via INTRAVENOUS
  Administered 2020-03-16: 2 mg via INTRAVENOUS
  Filled 2020-03-16 (×2): qty 1

## 2020-03-16 MED ORDER — CEFAZOLIN SODIUM-DEXTROSE 1-4 GM/50ML-% IV SOLN
1.0000 g | Freq: Once | INTRAVENOUS | Status: AC
  Filled 2020-03-16: qty 50

## 2020-03-16 MED ORDER — CEFAZOLIN SODIUM-DEXTROSE 1-4 GM/50ML-% IV SOLN
INTRAVENOUS | Status: AC
  Administered 2020-03-16: 1 g via INTRAVENOUS
  Filled 2020-03-16: qty 50

## 2020-03-16 MED ORDER — FENTANYL CITRATE (PF) 100 MCG/2ML IJ SOLN
INTRAMUSCULAR | Status: AC
  Filled 2020-03-16: qty 2

## 2020-03-16 MED ORDER — FENTANYL CITRATE (PF) 100 MCG/2ML IJ SOLN
INTRAMUSCULAR | Status: DC | PRN
  Administered 2020-03-16: 50 ug via INTRAVENOUS

## 2020-03-16 SURGICAL SUPPLY — 14 items
BALLN DORADO 8X100X80 (BALLOONS) ×2
BALLOON DORADO 8X100X80 (BALLOONS) IMPLANT
CANISTER PENUMBRA ENGINE (MISCELLANEOUS) ×1 IMPLANT
CANNULA 5F STIFF (CANNULA) ×1 IMPLANT
CATH BEACON 5 .035 65 KMP TIP (CATHETERS) ×1 IMPLANT
CATH INDIGO 12XTORQ 100 (CATHETERS) ×1 IMPLANT
CATH INDIGO SEP 12 (CATHETERS) ×1 IMPLANT
DEVICE PRESTO INFLATION (MISCELLANEOUS) ×1 IMPLANT
PACK ANGIOGRAPHY (CUSTOM PROCEDURE TRAY) ×1 IMPLANT
SHEATH PINNACLE 11FRX10 (SHEATH) ×1 IMPLANT
SYR MEDRAD MARK 7 150ML (SYRINGE) ×1 IMPLANT
TUBING CONTRAST HIGH PRESS 72 (TUBING) ×2 IMPLANT
WIRE J 3MM .035X145CM (WIRE) ×3 IMPLANT
WIRE MAGIC TORQUE 260C (WIRE) ×1 IMPLANT

## 2020-03-16 NOTE — Consult Note (Signed)
ANTICOAGULATION CONSULT NOTE  Pharmacy Consult for Heparin Indication: DVT  Allergies  Allergen Reactions  . Tetracyclines & Related Nausea And Vomiting    Flu symptoms    Patient Measurements: Height: 5\' 6"  (167.6 cm) Weight: 70.3 kg (155 lb) IBW/kg (Calculated) : 63.8 Heparin Dosing Weight: 70.3 kg  Vital Signs: Temp: 98.3 F (36.8 C) (06/30 1708) Temp Source: Oral (06/30 1708) BP: 147/86 (06/30 1708) Pulse Rate: 92 (06/30 1708)  Labs: Recent Labs    03/15/20 1208 03/15/20 1210 03/15/20 2031 03/16/20 0547 03/16/20 1722  HGB 15.7  --   --  13.2  --   HCT 45.1  --   --  38.5*  --   PLT 199  --   --  183  --   APTT  --  28  --   --   --   LABPROT 12.4  --   --   --   --   INR 1.0  --   --   --   --   HEPARINUNFRC  --   --  0.93* 0.85* 0.49  CREATININE 1.47*  --   --  1.34*  --     Estimated Creatinine Clearance: 37.7 mL/min (A) (by C-G formula based on SCr of 1.34 mg/dL (H)).   Medical History: Past Medical History:  Diagnosis Date  . Arthritis     Medications:  Medications Prior to Admission  Medication Sig Dispense Refill Last Dose  . Multiple Vitamin (MULTIVITAMIN WITH MINERALS) TABS tablet Take 1 tablet by mouth daily.   03/14/2020 at Unknown time   Scheduled:  Infusions:  PRN:  Anti-infectives (From admission, onward)   Start     Dose/Rate Route Frequency Ordered Stop   03/16/20 0230  ceFAZolin (ANCEF) IVPB 1 g/50 mL premix       Note to Pharmacy: To be given in specials   1 g 100 mL/hr over 30 Minutes Intravenous  Once 03/16/20 0217 03/16/20 1535      Assessment: Pharmacy consulted to start heparin for DVT. No DOAC use noted PTA  S/p thrombolysis on 6/30. Drop in hgb most likely dilutional.   6/30 0547 HL 0.85  Heparin held at 1446 and restarted on 1719 per chart. (spoke with RN) 6/30 1722 HL 0.49 probably not accurate.    Goal of Therapy:  Heparin level 0.3-0.7 units/ml Monitor platelets by anticoagulation protocol: Yes   Plan:   Heparin level is therapeutic, but not accurate if heparin was restarted at 1719.. Will continue heparin infusion at 750 units/hr and will recheck HL in 6 hours from restart. CBC daily while on heparin.    7/30, PharmD, BCPS 03/16/2020,5:51 PM

## 2020-03-16 NOTE — Op Note (Signed)
[facility]   OPERATIVE NOTE   PRE-OPERATIVE DIAGNOSIS: extensive left lower extremity DVT  POST-OPERATIVE DIAGNOSIS: same   PROCEDURE: 1.   US guidance for vascular access to left popliteal vein 2.   Catheter placement into left external iliac vein from left popliteal approach 3.   IVC gram and left lower extremity venogram 4.   Catheter directed thrombolysis with 8 mg of TPA to the left common femoral vein, superficial femoral vein, and popliteal vein 5.   Mechanical thrombectomy to the left popliteal vein, superficial femoral vein, and common femoral vein with the penumbra CAT 12 device 6.   PTA of left superficial femoral vein and common femoral vein with 8 mm balloon 7.   PTA of the left external iliac vein with 8 mm balloon    SURGEON: Leotis Pain, MD  ASSISTANT(S): none  ANESTHESIA: local with moderate conscious sedation for 30 minutes using 2 mg of Versed and 50 mcg of Fentanyl  ESTIMATED BLOOD LOSS: 200 cc  FINDING(S): 1.   Extensive thrombus in the left popliteal vein, superficial femoral vein, and common femoral vein.  There is narrowing of the junction of the superficial femoral vein to the common femoral vein as well as within the common femoral vein and external iliac vein.  No thrombus was seen in the iliac veins or the IVC.  SPECIMEN(S):  none  INDICATIONS:    Patient is a 86 y.o. male who presents with extensive left lower extremity DVT.  Patient has marked leg swelling and pain.  Venous intervention is performed to reduce the symtpoms and avoid long term postphlebitic symptoms.    DESCRIPTION: After obtaining full informed written consent, the patient was brought back to the vascular suite and placed supine upon the table. Moderate conscious sedation was administered during a face to face encounter with the patient throughout the procedure with my supervision of the RN administering medicines and monitoring the patient's vital signs, pulse  oximetry, telemetry and mental status throughout from the start of the procedure until the patient was taken to the recovery room.  After obtaining adequate anesthesia, the patient was prepped and draped in the standard fashion.    The patient was then placed into the prone position.  The left popliteal vein was then accessed under direct ultrasound guidance without difficulty with a micropuncture needle and a permanent image was recorded.  I then upsized to an 8Fr sheath over a J wire.   3000 units of heparin were then given.  Imaging showed extensive DVT with minimal flow.  A Kumpe catheter and Magic tourque wire were then advanced into the SFV and images were performed.  I was able to cross the thrombus and stenosis and advance into the left external iliac vein which was patent although it was small and appeared narrowed.  I then used the Kumpe catheter and instilled 8 mg of tpa throughout the left common femoral, superficial femoral, and popliteal veins.  After this dwelled, I used the Penumbra Cat 12 catheter and evacuated about 100 cc of effluent with mechanical thrombectomy throughout the CFV, SFV, and popliteal vein.  This had marked improvement.   Popliteal vein from the site of access proximal in the mid to distal superficial femoral vein were all now patent with no significant residual thrombus.  There is still a significant amount of residual thrombus in the common femoral vein and the junction of the superficial femoral vein to the common femoral vein.  Another pass with  the penumbra CAT 12 catheter this time using the separator was performed removing the majority of the clot from this location.  There appeared to be a fairly tight narrowing of the superficial femoral vein into the common femoral vein of greater than 60% in the common femoral vein and external iliac veins appeared significantly narrowed compared to the more proximal and distal veins.  I elected to treat these areas with angioplasty.   The Magic torque wire was replaced and I selected an 8 mm diameter by 10 cm length angioplasty balloon and first inflated the proximal superficial femoral vein and the junction to the common femoral vein inflating this to 10 atm for 1 minute.  The external iliac vein and proximal common femoral vein were then treated with the same 8 mm diameter by 10 cm length balloon inflated to 8 atm for 1 minute.  Imaging showed less than 30% residual stenosis of the junction the superficial femoral vein and the common femoral vein.  The external iliac vein appeared somewhat diffusely narrowed but improved and there is no significant residual thrombus so I felt this would improve.  I then elected to terminate the procedure.  The sheath was removed and a dressing was placed.  She was taken to the recovery room in stable condition having tolerated the procedure well.    COMPLICATIONS: None  CONDITION: Stable  Leotis Pain 03/16/2020 3:56 PM

## 2020-03-16 NOTE — H&P (Signed)
[facility] History & Physical Update  The patient was interviewed and re-examined.  The patient's previous History and Physical has been reviewed and is unchanged.  There is no change in the plan of care. We plan to proceed with the scheduled procedure.  Festus Barren, MD  03/16/2020, 2:51 PM

## 2020-03-16 NOTE — Consult Note (Signed)
ANTICOAGULATION CONSULT NOTE  Pharmacy Consult for Heparin Indication: DVT  Allergies  Allergen Reactions  . Tetracyclines & Related Nausea And Vomiting    Flu symptoms    Patient Measurements: Height: 5\' 6"  (167.6 cm) Weight: 70.3 kg (155 lb) IBW/kg (Calculated) : 63.8 Heparin Dosing Weight: 70.3 kg  Vital Signs: Temp: 97.6 F (36.4 C) (06/30 0348) Temp Source: Oral (06/30 0348) BP: 137/67 (06/30 0348) Pulse Rate: 58 (06/30 0348)  Labs: Recent Labs    03/15/20 1208 03/15/20 1210 03/15/20 2031 03/16/20 0547  HGB 15.7  --   --  13.2  HCT 45.1  --   --  38.5*  PLT 199  --   --  183  APTT  --  28  --   --   LABPROT 12.4  --   --   --   INR 1.0  --   --   --   HEPARINUNFRC  --   --  0.93* 0.85*  CREATININE 1.47*  --   --  1.34*    Estimated Creatinine Clearance: 37.7 mL/min (A) (by C-G formula based on SCr of 1.34 mg/dL (H)).   Medical History: Past Medical History:  Diagnosis Date  . Arthritis     Medications:  Medications Prior to Admission  Medication Sig Dispense Refill Last Dose  . Multiple Vitamin (MULTIVITAMIN WITH MINERALS) TABS tablet Take 1 tablet by mouth daily.   03/14/2020 at Unknown time   Scheduled:  Infusions:  PRN:  Anti-infectives (From admission, onward)   Start     Dose/Rate Route Frequency Ordered Stop   03/16/20 0230  ceFAZolin (ANCEF) IVPB 1 g/50 mL premix     Discontinue    Note to Pharmacy: To be given in specials   1 g 100 mL/hr over 30 Minutes Intravenous  Once 03/16/20 0217        Assessment: Pharmacy consulted to start heparin for DVT. No DOAC use noted PTA  Per vascular, patient will likely undergo thrombectomy.  6/30:  Decrease in hemoglobin observed, but still WNL.  Platelets stable.  Goal of Therapy:  Heparin level 0.3-0.7 units/ml Monitor platelets by anticoagulation protocol: Yes   Plan:  06/30 @ 0547 HL 0.85 supratherapeutic. Will reduce rate to 750 units/hr and will recheck HL at 1600.  Spoke with nurse, and  no s/sx of bleed reported.  Will check CBC with AM labs, and ensure hemoglobin remains stable.  7/30, PharmD 03/16/2020,7:24 AM

## 2020-03-16 NOTE — Progress Notes (Signed)
PROGRESS NOTE  Bryan Garza ZOX:[id number] DOB: 1933/12/22 DOA: 03/15/2020 PCP: Patient, No Pcp Per  Brief History   Bryan Garza is a 86 y.o. male with medical history significant for arthritis.  He presents to the emergency room for evaluation of a 1 month history of left leg pain and swelling.  He describes it as a constant discomfort in his left lower extremity.  He denies any trauma, denies having any fever or chills, denies any recent surgery or hospitalization.  He denies any weight loss, no loss of appetite and no night sweats. Left leg venous Doppler extensive left lower extremity femoropopliteal acute occlusive DVT extending into the calf veins. Twelve-lead EKG shows sinus rhythm with an incomplete left bundle branch block and premature atrial complexes  Patient  is found to have extensive left lower extremity femoral-popliteal acute occlusive DVT extending into the calf veins.  Patient will be admitted to the hospital for further evaluation and vascular surgery was consulted. Due to the extent of the thrombosis and the patient's symptoms, the patient will go for thrombectomy later today.  Consultants  . Vascular surgery  Procedures  . None  Antibiotics   Anti-infectives (From admission, onward)   Start     Dose/Rate Route Frequency Ordered Stop   03/16/20 0230  ceFAZolin (ANCEF) IVPB 1 g/50 mL premix     Discontinue    Note to Pharmacy: To be given in specials   1 g 100 mL/hr over 30 Minutes Intravenous  Once 03/16/20 0217      .  Subjective  The patient is resting in bed. The patient is having pain in his left leg.  Objective   Vitals:  Vitals:   03/16/20 0700 03/16/20 1100  BP: (!) 154/82 138/63  Pulse: 65 65  Resp: 20 16  Temp: 98 F (36.7 C) 98.1 F (36.7 C)  SpO2: 98%     Exam: Exam:  Constitutional:  . The patient is awake, alert, and oriented x 3. No acute distress. Respiratory:  . No increased work of breathing. . No wheezes,  rales, or rhonchi . No tactile fremitus Cardiovascular:  . Regular rate and rhythm . No murmurs, ectopy, or gallups. . No lateral PMI. No thrills. Abdomen:  . Abdomen is soft, non-tender, non-distended . No hernias, masses, or organomegaly . Normoactive bowel sounds.  Musculoskeletal:  . No cyanosis, clubbing, or edema Skin:  . No rashes, lesions, ulcers . palpation of skin: no induration or nodules Neurologic:  . CN 2-12 intact . Sensation all 4 extremities intact Psychiatric:  . Mental status o Mood, affect appropriate o Orientation to person, place, time  . judgment and insight appear intact  I have personally reviewed the following:   Today's Data  . Vitals, BMP, CBC  Imaging  . CXR  Scheduled Meds: . multivitamin with minerals  1 tablet Oral Daily  . sodium chloride flush  3 mL Intravenous Q12H   Continuous Infusions: . sodium chloride 75 mL/hr at 03/16/20 0400  . sodium chloride 75 mL/hr at 03/16/20 0803  .  ceFAZolin (ANCEF) IV    . heparin 750 Units/hr (03/16/20 0734)    Principal Problem:   DVT (deep venous thrombosis) (HCC)   LOS: 1 day   A & P  Left leg DVT: Left leg pain and swelling due to left lower extremity femoropopliteal acute occlusive DVT extending into the calf veins.   Arthritis: Noted.   AKI on CKD IIa: Monitor creatinine, electrolytes, and volume  status. Avoid nephrotoxins and hypotension.   Left leg pain: Pain control with morphine today while the patient is NPO. It is anticipated that thrombectomy will go a long way towards resolving the patient's symptoms.  I have seen and examined this patient myself. I have spent 32 minutes in his evaluation and care.  Mykalah Saari, DO [facility] Direct contact: see [email or web address]  7PM-7AM contact night coverage as above 03/16/2020, 1:12 PM  LOS: 1 day

## 2020-03-17 ENCOUNTER — Telehealth (INDEPENDENT_AMBULATORY_CARE_PROVIDER_SITE_OTHER): Payer: Self-pay

## 2020-03-17 LAB — CREATININE, SERUM
Creatinine, Ser: 1.14 mg/dL (ref 0.61–1.24)
GFR calc Af Amer: >60 mL/min (ref >60)
GFR calc non Af Amer: 59 mL/min — ABNORMAL LOW (ref >60)

## 2020-03-17 LAB — URINALYSIS, COMPLETE (UACMP) WITH MICROSCOPIC
Bilirubin Urine: NEGATIVE
Glucose, UA: NEGATIVE mg/dL
Hgb urine dipstick: NEGATIVE
Ketones, ur: 20 mg/dL — AB
Leukocytes,Ua: NEGATIVE
Nitrite: NEGATIVE
Protein, ur: NEGATIVE mg/dL
Specific Gravity, Urine: 1.019 (ref 1.005–1.030)
pH: 5 (ref 5.0–8.0)

## 2020-03-17 LAB — CBC
HCT: 37.8 % — ABNORMAL LOW (ref 39.0–52.0)
Hemoglobin: 13.1 g/dL (ref 13.0–17.0)
MCH: 30.6 pg (ref 26.0–34.0)
MCHC: 34.7 g/dL (ref 30.0–36.0)
MCV: 88.3 fL (ref 80.0–100.0)
Platelets: 166 10*3/uL (ref 150–400)
RBC: 4.28 MIL/uL (ref 4.22–5.81)
RDW: 12.6 % (ref 11.5–15.5)
WBC: 5.9 10*3/uL (ref 4.0–10.5)
nRBC: 0 % (ref 0.0–0.2)

## 2020-03-17 LAB — HEPARIN LEVEL (UNFRACTIONATED): Heparin Unfractionated: 0.36 IU/mL (ref 0.30–0.70)

## 2020-03-17 MED ORDER — QUETIAPINE FUMARATE 25 MG PO TABS: 25.0000 mg | ORAL_TABLET | Freq: Two times a day (BID) | ORAL | Status: DC

## 2020-03-17 MED ORDER — APIXABAN 5 MG PO TABS: 5.0000 mg | ORAL_TABLET | Freq: Two times a day (BID) | ORAL | Status: DC

## 2020-03-17 MED ORDER — QUETIAPINE FUMARATE 25 MG PO TABS: 25.0000 mg | ORAL_TABLET | Freq: Two times a day (BID) | ORAL | Status: DC | PRN

## 2020-03-17 MED ORDER — APIXABAN 5 MG PO TABS
10.0000 mg | ORAL_TABLET | Freq: Two times a day (BID) | ORAL | Status: DC
  Administered 2020-03-17 – 2020-03-18 (×3): 10 mg via ORAL
  Filled 2020-03-17 (×2): qty 2

## 2020-03-17 NOTE — Consult Note (Signed)
ANTICOAGULATION CONSULT NOTE  Pharmacy Consult for Eliquis Indication: DVT  Allergies  Allergen Reactions  . Tetracyclines & Related Nausea And Vomiting    Flu symptoms    Patient Measurements: Height: 5\' 6"  (167.6 cm) Weight: 70.3 kg (155 lb) IBW/kg (Calculated) : 63.8  Vital Signs: Temp: 98.2 F (36.8 C) (07/01 0546) Temp Source: Oral (07/01 0700) BP: 149/81 (07/01 0700) Pulse Rate: 72 (07/01 0700)  Labs: Recent Labs     0000 03/15/20 1208 03/15/20 1210 03/15/20 2031 03/16/20 0547 03/16/20 0547 03/16/20 1722 03/16/20 2321 03/17/20 0506  HGB   < > 15.7  --   --  13.2  --   --   --  13.1  HCT  --  45.1  --   --  38.5*  --   --   --  37.8*  PLT  --  199  --   --  183  --   --   --  166  APTT  --   --  28  --   --   --   --   --   --   LABPROT  --  12.4  --   --   --   --   --   --   --   INR  --  1.0  --   --   --   --   --   --   --   HEPARINUNFRC  --   --   --    < > 0.85*   < > 0.49 0.37 0.36  CREATININE  --  1.47*  --   --  1.34*  --   --   --   --    < > = values in this interval not displayed.    Estimated Creatinine Clearance: 37.7 mL/min (A) (by C-G formula based on SCr of 1.34 mg/dL (H)).   Medical History: Past Medical History:  Diagnosis Date  . Arthritis     Medications:  Medications Prior to Admission  Medication Sig Dispense Refill Last Dose  . Multiple Vitamin (MULTIVITAMIN WITH MINERALS) TABS tablet Take 1 tablet by mouth daily.   03/14/2020 at Unknown time   Scheduled:  Infusions:  PRN:  Anti-infectives (From admission, onward)   Start     Dose/Rate Route Frequency Ordered Stop   03/16/20 0230  ceFAZolin (ANCEF) IVPB 1 g/50 mL premix       Note to Pharmacy: To be given in specials   1 g 100 mL/hr over 30 Minutes Intravenous  Once 03/16/20 0217 03/16/20 1535      Assessment: Pharmacy consulted to transition from heparin infusion to Eliquis for DVT. No prior DOAC use noted PTA.  S/p thrombolysis on 6/30.  Goal of Therapy:   Monitor platelets by anticoagulation protocol: Yes   Plan:  Will start Eliquis 10 mg BID x 7 days followed by 5 mg BID.   CBC stable with slight down trend in platelets.  Baseline serum creatinine ordered and appears to be improving.  Pharmacy will follow serum creatinine and CBC every 3 days per protocol.   7/30, PharmD 03/17/2020,1:29 PM

## 2020-03-17 NOTE — Plan of Care (Signed)
Pt with periods of confusion.

## 2020-03-17 NOTE — Progress Notes (Signed)
PROGRESS NOTE  Kevontay Burks POE:[id number] DOB: 1934/03/08 DOA: 03/15/2020 PCP: Patient, No Pcp Per  Brief History   Savan Ruta is a 86 y.o. male with medical history significant for arthritis.  He presents to the emergency room for evaluation of a 1 month history of left leg pain and swelling.  He describes it as a constant discomfort in his left lower extremity.  He denies any trauma, denies having any fever or chills, denies any recent surgery or hospitalization.  He denies any weight loss, no loss of appetite and no night sweats. Left leg venous Doppler extensive left lower extremity femoropopliteal acute occlusive DVT extending into the calf veins. Twelve-lead EKG shows sinus rhythm with an incomplete left bundle branch block and premature atrial complexes  Patient  is found to have extensive left lower extremity femoral-popliteal acute occlusive DVT extending into the calf veins.  Patient will be admitted to the hospital for further evaluation and vascular surgery was consulted. Due to the extent of the thrombosis and the patient's symptoms, the patient will go for thrombectomy later today.  Consultants  . Vascular surgery  Procedures  . Catheterization of left ext iliac from lt popliteal, le lower venogram, catheterdirected thrombolysis with TPA to the left popliteal vein, superficial femoral vein, and common femoral vein. He had PTA to left superficial femoral vein and common femoral vein with 27mm balloon. There was also PTA of the left external iliac vein with the 8 mm balloon.  Antibiotics   Anti-infectives (From admission, onward)   Start     Dose/Rate Route Frequency Ordered Stop   03/16/20 0230  ceFAZolin (ANCEF) IVPB 1 g/50 mL premix       Note to Pharmacy: To be given in specials   1 g 100 mL/hr over 30 Minutes Intravenous  Once 03/16/20 0217 03/16/20 1535     Subjective  The patient is resting in bed. The patient states that his left leg feels much  better. Nursing reports that the patient has been very agitated and confused overnight. He has had to have his left leg re-wrapped twice, because he has undone it.   Objective   Vitals:  Vitals:   03/17/20 0546 03/17/20 0700  BP: (!) 149/71 (!) 149/81  Pulse: 70 72  Resp: 16 17  Temp: 98.2 F (36.8 C)   SpO2: 98%    Exam:  Constitutional:  . The patient is awake, alert, and oriented x 3. No acute distress. Respiratory:  . No increased work of breathing. . No wheezes, rales, or rhonchi . No tactile fremitus Cardiovascular:  . Regular rate and rhythm . No murmurs, ectopy, or gallups. . No lateral PMI. No thrills. Abdomen:  . Abdomen is soft, non-tender, non-distended . No hernias, masses, or organomegaly . Normoactive bowel sounds.  Musculoskeletal:  . No cyanosis, clubbing, or edema . Left lower extremity is bandaged. Skin:  . No rashes, lesions, ulcers . palpation of skin: no induration or nodules Neurologic:  . CN 2-12 intact . Sensation all 4 extremities intact Psychiatric:  . Mental status o Mood, affect appropriate o Orientation to person, place, time  . judgment and insight appear intact  I have personally reviewed the following:   Today's Data  . Vitals, CBC  Imaging  . CXR  Scheduled Meds: . apixaban  10 mg Oral BID   Followed by  . [START ON 03/24/2020] apixaban  5 mg Oral BID  . multivitamin with minerals  1 tablet Oral Daily  .  sodium chloride flush  3 mL Intravenous Q12H    Principal Problem:   DVT (deep venous thrombosis) (HCC)   LOS: 2 days   A & P  Left leg DVT: Left leg pain and swelling due to left lower extremity femoropopliteal acute occlusive DVT extending into the calf veins. S/P Catheterization of left ext iliac from lt popliteal, le lower venogram, catheterdirected thrombolysis with TPA to the left popliteal vein, superficial femoral vein, and common femoral vein. He had PTA to left superficial femoral vein and common femoral vein  with 30mm balloon. There was also PTA of the left external iliac vein with the 8 mm balloon. The patient has been transitioned from heparin to eliquis. PT/OT to evaluate and treat and help determine correct disposition.  Arthritis: Noted.   AKI on CKD IIa: Monitor creatinine, electrolytes, and volume status. Avoid nephrotoxins and hypotension.   Left leg pain: Better today after thrombectomy. Pain control with morphine today while the patient is NPO. It is anticipated that thrombectomy will go a long way towards resolving the patient's symptoms.  I have seen and examined this patient myself. I have spent 30 minutes in his evaluation and care.  Yuan Gann, DO [facility] Direct contact: see [email or web address]  7PM-7AM contact night coverage as above 03/17/2020, 1:46 PM  LOS: 1 day

## 2020-03-17 NOTE — Consult Note (Signed)
ANTICOAGULATION CONSULT NOTE  Pharmacy Consult for Heparin Indication: DVT  Allergies  Allergen Reactions  . Tetracyclines & Related Nausea And Vomiting    Flu symptoms    Patient Measurements: Height: 5\' 6"  (167.6 cm) Weight: 70.3 kg (155 lb) IBW/kg (Calculated) : 63.8 Heparin Dosing Weight: 70.3 kg  Vital Signs: Temp: 98.1 F (36.7 C) (06/30 2329) Temp Source: Oral (06/30 1708) BP: 139/73 (06/30 2329) Pulse Rate: 72 (06/30 2329)  Labs: Recent Labs    03/15/20 1208 03/15/20 1210 03/15/20 2031 03/16/20 0547 03/16/20 1722 03/16/20 2321  HGB 15.7  --   --  13.2  --   --   HCT 45.1  --   --  38.5*  --   --   PLT 199  --   --  183  --   --   APTT  --  28  --   --   --   --   LABPROT 12.4  --   --   --   --   --   INR 1.0  --   --   --   --   --   HEPARINUNFRC  --   --    < > 0.85* 0.49 0.37  CREATININE 1.47*  --   --  1.34*  --   --    < > = values in this interval not displayed.    Estimated Creatinine Clearance: 37.7 mL/min (A) (by C-G formula based on SCr of 1.34 mg/dL (H)).   Medical History: Past Medical History:  Diagnosis Date  . Arthritis     Medications:  Medications Prior to Admission  Medication Sig Dispense Refill Last Dose  . Multiple Vitamin (MULTIVITAMIN WITH MINERALS) TABS tablet Take 1 tablet by mouth daily.   03/14/2020 at Unknown time   Scheduled:  Infusions:  PRN:  Anti-infectives (From admission, onward)   Start     Dose/Rate Route Frequency Ordered Stop   03/16/20 0230  ceFAZolin (ANCEF) IVPB 1 g/50 mL premix       Note to Pharmacy: To be given in specials   1 g 100 mL/hr over 30 Minutes Intravenous  Once 03/16/20 0217 03/16/20 1535      Assessment: Pharmacy consulted to start heparin for DVT. No DOAC use noted PTA  S/p thrombolysis on 6/30. Drop in hgb most likely dilutional.   6/30 0547 HL 0.85  Heparin held at 1446 and restarted on 1719 per chart. (spoke with RN) 6/30 1722 HL 0.49 probably not accurate.    Goal of  Therapy:  Heparin level 0.3-0.7 units/ml Monitor platelets by anticoagulation protocol: Yes   Plan:  06/30 @ 2330 HL 0.37 therapeutic. Will continue current rate and will recheck HL w/ am labs and continue to monitor.  7/30, PharmD, BCPS 03/17/2020,1:40 AM

## 2020-03-17 NOTE — Consult Note (Signed)
ANTICOAGULATION CONSULT NOTE  Pharmacy Consult for Heparin Indication: DVT  Allergies  Allergen Reactions  . Tetracyclines & Related Nausea And Vomiting    Flu symptoms    Patient Measurements: Height: 5\' 6"  (167.6 cm) Weight: 70.3 kg (155 lb) IBW/kg (Calculated) : 63.8 Heparin Dosing Weight: 70.3 kg  Vital Signs: Temp: 98.2 F (36.8 C) (07/01 0546) Temp Source: Oral (07/01 0546) BP: 149/71 (07/01 0546) Pulse Rate: 70 (07/01 0546)  Labs: Recent Labs     0000 03/15/20 1208 03/15/20 1210 03/15/20 2031 03/16/20 0547 03/16/20 0547 03/16/20 1722 03/16/20 2321 03/17/20 0506  HGB   < > 15.7  --   --  13.2  --   --   --  13.1  HCT  --  45.1  --   --  38.5*  --   --   --  37.8*  PLT  --  199  --   --  183  --   --   --  166  APTT  --   --  28  --   --   --   --   --   --   LABPROT  --  12.4  --   --   --   --   --   --   --   INR  --  1.0  --   --   --   --   --   --   --   HEPARINUNFRC  --   --   --    < > 0.85*   < > 0.49 0.37 0.36  CREATININE  --  1.47*  --   --  1.34*  --   --   --   --    < > = values in this interval not displayed.    Estimated Creatinine Clearance: 37.7 mL/min (A) (by C-G formula based on SCr of 1.34 mg/dL (H)).   Medical History: Past Medical History:  Diagnosis Date  . Arthritis     Medications:  Medications Prior to Admission  Medication Sig Dispense Refill Last Dose  . Multiple Vitamin (MULTIVITAMIN WITH MINERALS) TABS tablet Take 1 tablet by mouth daily.   03/14/2020 at Unknown time   Scheduled:  Infusions:  PRN:  Anti-infectives (From admission, onward)   Start     Dose/Rate Route Frequency Ordered Stop   03/16/20 0230  ceFAZolin (ANCEF) IVPB 1 g/50 mL premix       Note to Pharmacy: To be given in specials   1 g 100 mL/hr over 30 Minutes Intravenous  Once 03/16/20 0217 03/16/20 1535      Assessment: Pharmacy consulted to start heparin for DVT. No DOAC use noted PTA  S/p thrombolysis on 6/30. Drop in hgb most likely  dilutional.   6/30 0547 HL 0.85  Heparin held at 1446 and restarted on 1719 per chart. (spoke with RN) 6/30 1722 HL 0.49 probably not accurate.    Goal of Therapy:  Heparin level 0.3-0.7 units/ml Monitor platelets by anticoagulation protocol: Yes   Plan:  07/01 @ 0500 HL 0.36 therapeutic. Will continue current rate and will recheck HL w/ am labs and continue to monitor.  09/01, PharmD, BCPS 03/17/2020,6:52 AM

## 2020-03-17 NOTE — Telephone Encounter (Signed)
Patient's daughter called asking questions regarding compression hose for the patient. Per Dr. Wyn Quaker the patient needs medium medical grade to the knee compression hose.

## 2020-03-17 NOTE — Consult Note (Deleted)
ANTICOAGULATION CONSULT NOTE  Pharmacy Consult for Eliquis Indication: DVT  Allergies  Allergen Reactions  . Tetracyclines & Related Nausea And Vomiting    Flu symptoms    Patient Measurements: Height: 5\' 6"  (167.6 cm) Weight: 70.3 kg (155 lb) IBW/kg (Calculated) : 63.8 Heparin Dosing Weight: 70.3 kg  Vital Signs: Temp: 98.2 F (36.8 C) (07/01 0546) Temp Source: Oral (07/01 0700) BP: 149/81 (07/01 0700) Pulse Rate: 72 (07/01 0700)  Labs: Recent Labs     0000 03/15/20 1208 03/15/20 1210 03/15/20 2031 03/16/20 0547 03/16/20 0547 03/16/20 1722 03/16/20 2321 03/17/20 0506  HGB   < > 15.7  --   --  13.2  --   --   --  13.1  HCT  --  45.1  --   --  38.5*  --   --   --  37.8*  PLT  --  199  --   --  183  --   --   --  166  APTT  --   --  28  --   --   --   --   --   --   LABPROT  --  12.4  --   --   --   --   --   --   --   INR  --  1.0  --   --   --   --   --   --   --   HEPARINUNFRC  --   --   --    < > 0.85*   < > 0.49 0.37 0.36  CREATININE  --  1.47*  --   --  1.34*  --   --   --   --    < > = values in this interval not displayed.    Estimated Creatinine Clearance: 37.7 mL/min (A) (by C-G formula based on SCr of 1.34 mg/dL (H)).   Medical History: Past Medical History:  Diagnosis Date  . Arthritis     Medications:  Medications Prior to Admission  Medication Sig Dispense Refill Last Dose  . Multiple Vitamin (MULTIVITAMIN WITH MINERALS) TABS tablet Take 1 tablet by mouth daily.   03/14/2020 at Unknown time   Scheduled:  Infusions:  PRN:  Anti-infectives (From admission, onward)   Start     Dose/Rate Route Frequency Ordered Stop   03/16/20 0230  ceFAZolin (ANCEF) IVPB 1 g/50 mL premix       Note to Pharmacy: To be given in specials   1 g 100 mL/hr over 30 Minutes Intravenous  Once 03/16/20 0217 03/16/20 1535      Assessment: Pharmacy consulted to start heparin for DVT. No DOAC use noted PTA  S/p thrombolysis on 6/30. Drop in hgb most likely  dilutional.   6/30 0547 HL 0.85  Heparin held at 1446 and restarted on 1719 per chart. (spoke with RN) 6/30 1722 HL 0.49 probably not accurate.    Goal of Therapy:  Heparin level 0.3-0.7 units/ml Monitor platelets by anticoagulation protocol: Yes   Plan:  07/01 @ 0500 HL 0.36 therapeutic. Will continue current rate and will recheck HL w/ am labs and continue to monitor.  09/01, PharmD, BCPS 03/17/2020,12:01 PM

## 2020-03-17 NOTE — Progress Notes (Signed)
[facility] Daily Progress Note   Subjective: 03/16/20: 1. US guidance for vascular access to left popliteal vein 2. Catheter placement into left external iliac vein from left popliteal approach 3. IVC gram and left lower extremity venogram 4.   Catheter directed thrombolysis with 8 mg of TPA to the left common femoral vein, superficial femoral vein, and popliteal vein 5. Mechanical thrombectomy to the left popliteal vein, superficial femoral vein, and common femoral vein with the penumbra CAT 12 device 6. PTA of left superficial femoral vein and common femoral vein with 8 mm balloon 7.   PTA of the left external iliac vein with 8 mm balloon  Patient without complaint this AM.  No issues at night.  Objective: Vitals:   03/16/20 1932 03/16/20 2329 03/17/20 0546 03/17/20 0700  BP: (!) 141/77 139/73 (!) 149/71 (!) 149/81  Pulse: 80 72 70 72  Resp: 17 17 16 17   Temp: 98.2 F (36.8 C) 98.1 F (36.7 C) 98.2 F (36.8 C)   TempSrc:   Oral Oral  SpO2: 97% 96% 98%   Weight:      Height:        Intake/Output Summary (Last 24 hours) at 03/17/2020 1148 Last data filed at 03/17/2020 0253 Gross per 24 hour  Intake --  Output 275 ml  Net -275 ml   Physical Exam: A&Ox3, NAD CV: RRR Pulmonary: CTA Bilaterally Abdomen: Soft, Nontender, Nondistended Vascular:  Left lower extremity: Compression dressing in place.  Thigh soft.  Calf soft.  Toes are warm.  Good capillary refill.  Motor/sensory is intact.   Laboratory: CBC    Component Value Date/Time   WBC 5.9 03/17/2020 0506   HGB 13.1 03/17/2020 0506   HCT 37.8 (L) 03/17/2020 0506   PLT 166 03/17/2020 0506   BMET    Component Value Date/Time   NA 141 03/16/2020 0547   K 4.0 03/16/2020 0547   CL 107 03/16/2020 0547   CO2 27 03/16/2020 0547   GLUCOSE 102 (H) 03/16/2020 0547   BUN 21 03/16/2020 0547   CREATININE 1.34 (H) 03/16/2020 0547   CALCIUM 8.8 (L) 03/16/2020 0547   GFRNONAA 49 (L) 03/16/2020  0547   GFRAA 56 (L) 03/16/2020 0547   Assessment/Planning: The patient is an 86 year old male status post left lower extremity venous thrombectomy / thrombolysis - POD#1  1) hemoglobin is stable. 2) we will transition from heparin to Eliquis.  Pharmacy consulted. 3) would leave compression dressing to the left lower extremity on until the patient is discharged. 4) ambulation / out of bed today.  Discussed with Dr. 91 Lyndle Pang PA-C 03/17/2020 11:48 AM

## 2020-03-17 NOTE — Evaluation (Addendum)
Physical Therapy Evaluation Patient Details Name: Bryan Garza MRN: [id number] DOB: 10-Feb-1934 Today's Date: 03/17/2020   History of Present Illness  86 y.o. male with medical history significant for arthritis.  He presents to the emergency room for evaluation of a 1 month history of left leg pain and swelling.  He describes it as a constant discomfort in his left lower extremity.  Found to have extensive L LE DVTs, s/p 86 y.o. male with medical history significant for arthritis.  He presents to the emergency room for evaluation of a 1 month history of left leg pain/swelling.  Found to have extensive L LE DVTs and is now s/p catheterization with thrombectomy 6/30.  Clinical Impression  Pt was able to easily and confidently get to sitting EOB, standing and then confidently was able to circumambulate the nurses' station w/o AD.  Overall he did well with purely PT related mobility, etc but did show some impulsivity and lack of safety awareness - though he did answer most questions seemingly appropriately.  Apparently he will be discharging to his daughter's home which is a much better plan than trying to discharge home alone.  Follow Up Recommendations Supervision - Intermittent    Equipment Recommendations  None recommended by PT    Recommendations for Other Services       Precautions / Restrictions Precautions Precautions: None Restrictions Weight Bearing Restrictions: No      Mobility  Bed Mobility Overal bed mobility: Modified Independent                Transfers Overall transfer level: Modified independent Equipment used: None             General transfer comment: Pt is able to rise to standing and maintain balance w/o assist and w/o safety issue.  Ambulation/Gait Ambulation/Gait assistance: Supervision Gait Distance (Feet): 200 Feet Assistive device: None       General Gait Details: Pt was able to maintain consistent and relatively confident cadence  while circumambulating the nurses' station.  His HR did increased to the 120s, O2 remained mid 90s t/o.    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  Apparently pt will be discharging to daughter's home.   Prior Function Level of Independence: Independent         Comments: Pt perports to be able to manage all community errands/driving, states he is able to do all he needs w/o assist     Hand Dominance        Extremity/Trunk Assessment   Upper Extremity Assessment Upper Extremity Assessment: Overall WFL for tasks assessed    Lower Extremity Assessment Lower Extremity Assessment: Overall WFL for tasks assessed  Communication   Communication: No difficulties  Cognition Arousal/Alertness: Awake/alert Behavior During Therapy: Restless Overall Cognitive Status: Difficult to assess                                 General Comments: Pt alert to self, setting, date, etc however showed poor general awareness as well as impulsivity      General Comments General comments (skin integrity, edema, etc.): Pt with good confidence and ability throughout mobility, ambulation, etc    Exercises     Assessment/Plan    PT Assessment Patient needs continued PT services  PT Problem List Decreased strength;Decreased range of motion;Decreased  balance;Decreased cognition;Decreased safety awareness;Decreased activity tolerance;Decreased mobility;Decreased coordination;Cardiopulmonary status limiting activity       PT Treatment Interventions DME instruction;Gait training;Stair training;Functional mobility training;Therapeutic activities;Therapeutic exercise;Balance training;Cognitive remediation    PT Goals (Current goals can be found in the Care Plan section)  Acute Rehab PT Goals Patient Stated Goal: Pt wants to go home today PT Goal Formulation: With patient Time For Goal Achievement: 03/31/20 Potential to Achieve Goals: Fair    Frequency Min 2X/week   Barriers to discharge        Co-evaluation               AM-PAC PT "6 Clicks" Mobility  Outcome Measure Help needed turning from your back to your side while in a flat bed without using bedrails?: None Help needed moving from lying on your back to sitting on the side of a flat bed without using bedrails?: None Help needed moving to and from a bed to a chair (including a wheelchair)?: None Help needed standing up from a chair using your arms (e.g., wheelchair or bedside chair)?: None Help needed to walk in hospital room?: None Help needed climbing 3-5 steps with a railing? : A Little 6 Click Score: 23    End of Session Equipment Utilized During Treatment: Gait belt Activity Tolerance: Patient tolerated treatment well Patient left: with bed alarm set;with call bell/phone within reach (telesitter in room) Nurse Communication: Mobility status PT Visit Diagnosis: Muscle weakness (generalized) (M62.81);Difficulty in walking, not elsewhere classified (R26.2)    Time: 1355-1415 PT Time Calculation (min) (ACUTE ONLY): 20 min   Charges:   PT Evaluation $PT Eval Low Complexity: 1 Low          Kreg Shropshire, DPT 03/17/2020, 3:56 PM

## 2020-03-18 LAB — BASIC METABOLIC PANEL
Anion gap: 7 (ref 5–15)
BUN: 13 mg/dL (ref 8–23)
CO2: 26 mmol/L (ref 22–32)
Calcium: 8.8 mg/dL — ABNORMAL LOW (ref 8.9–10.3)
Chloride: 109 mmol/L (ref 98–111)
Creatinine, Ser: 1.26 mg/dL — ABNORMAL HIGH (ref 0.61–1.24)
GFR calc Af Amer: >60 mL/min (ref >60)
GFR calc non Af Amer: 52 mL/min — ABNORMAL LOW (ref >60)
Glucose, Bld: 107 mg/dL — ABNORMAL HIGH (ref 70–99)
Potassium: 3.6 mmol/L (ref 3.5–5.1)
Sodium: 142 mmol/L (ref 135–145)

## 2020-03-18 LAB — CBC
HCT: 35 % — ABNORMAL LOW (ref 39.0–52.0)
Hemoglobin: 12.3 g/dL — ABNORMAL LOW (ref 13.0–17.0)
MCH: 30.5 pg (ref 26.0–34.0)
MCHC: 35.1 g/dL (ref 30.0–36.0)
MCV: 86.8 fL (ref 80.0–100.0)
Platelets: 163 10*3/uL (ref 150–400)
RBC: 4.03 MIL/uL — ABNORMAL LOW (ref 4.22–5.81)
RDW: 12.5 % (ref 11.5–15.5)
WBC: 5.6 10*3/uL (ref 4.0–10.5)
nRBC: 0 % (ref 0.0–0.2)

## 2020-03-18 LAB — URINE CULTURE: Culture: NO GROWTH

## 2020-03-18 MED ORDER — APIXABAN 5 MG PO TABS: 10.0000 mg | ORAL_TABLET | Freq: Two times a day (BID) | ORAL | 0 refills | Status: DC

## 2020-03-18 MED ORDER — APIXABAN (ELIQUIS) VTE STARTER PACK (10MG AND 5MG)
ORAL_TABLET | Freq: Two times a day (BID) | ORAL | Status: DC
  Filled 2020-03-18: qty 1

## 2020-03-18 MED ORDER — APIXABAN 5 MG PO TABS: 5.0000 mg | ORAL_TABLET | Freq: Two times a day (BID) | ORAL | 0 refills | Status: DC

## 2020-03-18 MED ORDER — ELIQUIS DVT/PE STARTER PACK 5 MG PO TBPK: ORAL_TABLET | ORAL | 0 refills | Status: DC

## 2020-03-18 MED ORDER — HYDROCODONE-ACETAMINOPHEN 5-325 MG PO TABS: 1.0000 | ORAL_TABLET | Freq: Four times a day (QID) | ORAL | 0 refills | Status: DC | PRN

## 2020-03-18 NOTE — Progress Notes (Signed)
Patient received and understands discharge instructions 

## 2020-03-18 NOTE — TOC Initial Note (Signed)
Transition of Care [facility]) - Initial/Assessment Note    Patient Details  Name: Bryan Garza MRN: [id number] Date of Birth: 03/10/1934  [facility] CM/SW Contact:    Allayne Butcher, RN Phone Number: 03/18/2020, 1:27 PM  Clinical Narrative:                 Patient admitted to the hospital with DVT and underwent thrombolysis and thrombectomy with Vascular surgery.  Anticipated discharge this afternoon.  Patient will be going to live with his daughter at discharge.  Patient does not require any home health services or equipment at this time.  Daughter is working on getting patient set up with a PCP at [facility].  Daughter will be picking patient up when discharged.   Expected Discharge Plan: Home/Self Care Barriers to Discharge: No Barriers Identified   Patient Goals and CMS Choice Patient states their goals for this hospitalization and ongoing recovery are:: Saughter is just ready to come and get patient so he can take him home with her.      Expected Discharge Plan and Services Expected Discharge Plan: Home/Self Care       Living arrangements for the past 2 months: Single Family Home                                      Prior Living Arrangements/Services Living arrangements for the past 2 months: Single Family Home Lives with:: Self Patient language and need for interpreter reviewed:: Yes Do you feel safe going back to the place where you live?: Yes      Need for Family Participation in Patient Care: Yes (Comment) (DVT) Care giver support system in place?: Yes (comment) (daughter)   Criminal Activity/Legal Involvement Pertinent to Current Situation/Hospitalization: No - Comment as needed  Activities of Daily Living Home Assistive Devices/Equipment: None ADL Screening (condition at time of admission) Patient's cognitive ability adequate to safely complete daily activities?: Yes Is the patient deaf or have difficulty hearing?: No Does the  patient have difficulty seeing, even when wearing glasses/contacts?: No Does the patient have difficulty concentrating, remembering, or making decisions?: No Patient able to express need for assistance with ADLs?: Yes Does the patient have difficulty dressing or bathing?: No Independently performs ADLs?: Yes (appropriate for developmental age) Does the patient have difficulty walking or climbing stairs?: No Weakness of Legs: None Weakness of Arms/Hands: None  Permission Sought/Granted Permission sought to share information with : Family Supports, Case Estate manager/land agent granted to share information with : Yes, Verbal Permission Granted  Share Information with NAME: Bryan Garza     Permission granted to share info w Relationship: Daughter     Emotional Assessment Appearance:: Appears stated age Attitude/Demeanor/Rapport: Unable to Assess   Orientation: : Oriented to Self, Oriented to Place, Oriented to Situation Alcohol / Substance Use: Not Applicable Psych Involvement: No (comment)  Admission diagnosis:  DVT (deep venous thrombosis) (HCC) [I82.409] DVT of deep femoral vein, left (HCC) [I82.412] Patient Active Problem List   Diagnosis Date Noted  . DVT (deep venous thrombosis) (HCC) 03/15/2020   PCP:  Patient, No Pcp Per Pharmacy:   [facility] [phone] [place], [place] - [address] AT [facility] [address] [place] [address] Phone: [phone] Fax: [phone]     Social Determinants of Health (SDOH) Interventions    Readmission Risk Interventions No flowsheet data found.

## 2020-03-18 NOTE — Discharge Summary (Signed)
Physician Discharge Summary  Bryan BaconJesse C St [place] ZOX:[id number] DOB: 11-17-1933 DOA: 03/15/2020  PCP: Patient, No Pcp Per  Admit date: 03/15/2020 Discharge date: 03/18/2020  Recommendations for Outpatient Follow-up:  1. Keep left lower extremity elevated. 2. Wear 20-30 mmHg compression stocking on left LE 3. Follow up with Dr. Wyn Quakerew in 3-4 weeks. 4. Follow up with PCP in 7-10 days.   Follow-up Information    Bryan Garza, Bryan BaarsJason S, MD. Schedule an appointment as soon as possible for a visit in 2 weeks.   Specialties: Vascular Surgery, Radiology, Interventional Cardiology Why: Can see Bryan Garza or Bryan BirminghamFallon.  Patient will need a left lower extremity venous duplex for DVT with visit. Contact information: [address] [place] [address] [phone]              Discharge Diagnoses: Principal diagnosis is #1 1. Left leg DVT Garza/p thrombectomy 2. Arthritis 3. AKI on CKD IIa 4. Left leg pain.  Discharge Condition: Fair  Disposition: Home  Diet recommendation: Heart healthy  Filed Weights   03/15/20 0957  Weight: 70.3 kg    History of present illness:  Bryan BruceJesse C St Garza is a 86 y.o. male with medical history significant for arthritis.  He presents to the emergency room for evaluation of a 1 month history of left leg pain and swelling.  He describes it as a constant discomfort in his left lower extremity.  He denies any trauma, denies having any fever or chills, denies any recent surgery or hospitalization.  He denies any weight loss, no loss of appetite and no night sweats. Left leg venous Doppler extensive left lower extremity femoropopliteal acute occlusive DVT extending into the calf veins. Twelve-lead EKG shows sinus rhythm with an incomplete left bundle branch block and premature atrial complexes   ED Course: Patient is an 86 year old male who presents to the ER for evaluation of left leg pain and swelling and is found to have extensive left lower extremity femoral-popliteal acute  occlusive DVT extending into the calf veins.  Patient will be admitted to the hospital for further evaluation and vascular surgery has been consulted.   Hospital Course:  Bryan BaconJesse C St Germainis a 86 y.o.malewith medical history significant forarthritis.He presents to the emergency room for evaluation of a 1 month history of left leg pain and swelling. He describes itas a constant discomfort in his left lower extremity. He denies any trauma, denies having any fever or chills, denies any recent surgery or hospitalization.He denies any weight loss, no loss of appetite andno night sweats. Left leg venous Dopplerextensive left lower extremity femoropopliteal acute occlusive DVT extending into the calf veins. Twelve-lead EKG shows sinus rhythm with an incomplete left bundle branch blockandpremature atrial complexes  Patient  is found to have extensive left lower extremity femoral-popliteal acute occlusive DVT extending into the calf veins. Patient will be admitted to the hospital for further evaluation and vascular surgery was consulted.Due to the extent of the thrombosis and the patient'Garza symptoms, the patient undersent thrombectomy and PTA of the left lower extremity on 03/16/2020. He has tolerated the procedure well.  Today the patient has been cleared for discharge. He will be discharged with a compression stocking 20-30 mmHg daily. He will keep the left LE elevated as much as possible, and he will follow up with vascular surgery in 3-4 weeks.  Today'Garza assessment: Garza: The patient is resting comfortably. No new complaints. O: Vitals:  Vitals:   03/18/20 0524 03/18/20 1029  BP: 129/68 139/68  Pulse: 67 74  Resp: 16 16  Temp: 98.8 F (37.1 C) 98.6 F (37 C)  SpO2: 95% 97%   Constitutional:   The patient is awake, alert, and oriented x 3. No acute distress. Respiratory:   No increased work of breathing.  No wheezes, rales, or rhonchi  No tactile fremitus Cardiovascular:     Regular rate and rhythm  No murmurs, ectopy, or gallups.  No lateral PMI. No thrills. Abdomen:   Abdomen is soft, non-tender, non-distended  No hernias, masses, or organomegaly  Normoactive bowel sounds.  Musculoskeletal:   No cyanosis, clubbing, or edema  Left lower extremity is bandaged. Skin:   No rashes, lesions, ulcers  palpation of skin: no induration or nodules Neurologic:   CN 2-12 intact  Sensation all 4 extremities intact Psychiatric:   Mental status ? Mood, affect appropriate ? Orientation to person, place, time   judgment and insight appear intact  Discharge Instructions  Discharge Instructions    Call MD for:  redness, tenderness, or signs of infection (pain, swelling, redness, odor or green/yellow discharge around incision site)   Complete by: As directed    Call MD for:  severe uncontrolled pain   Complete by: As directed    Compression stockings   Complete by: As directed    20-30 mm Hg, Left lower extremity.   Diet - low sodium heart healthy   Complete by: As directed    Discharge instructions   Complete by: As directed    Keep left lower extremity elevated. Wear 20-30 mmHg compression stocking on left LE Follow up with Dr. Wyn Garza in 3-4 weeks. Follow up with PCP in 7-10 days.   Increase activity slowly   Complete by: As directed      Allergies as of 03/18/2020      Reactions   Tetracyclines & Related Nausea And Vomiting   Flu symptoms      Medication List    TAKE these medications   Eliquis DVT/PE Starter Pack Generic drug: Apixaban Starter Pack (10mg  and 5mg ) Take as directed on package: start with two-5mg  tablets twice daily for 7 days. On day 8, switch to one-5mg  tablet twice daily.   HYDROcodone-acetaminophen 5-325 MG tablet Commonly known as: NORCO/VICODIN Take 1 tablet by mouth every 6 (six) hours as needed for moderate pain or severe pain.   multivitamin with minerals Tabs tablet Take 1 tablet by mouth daily.       Allergies  Allergen Reactions  . Tetracyclines & Related Nausea And Vomiting    Flu symptoms    The results of significant diagnostics from this hospitalization (including imaging, microbiology, ancillary and laboratory) are listed below for reference.    Significant Diagnostic Studies: PERIPHERAL VASCULAR CATHETERIZATION  Result Date: 03/16/2020 See op note  Venous Img Lower Unilateral Left  Result Date: 03/15/2020 CLINICAL DATA:  Left leg swelling pain EXAM: LEFT LOWER EXTREMITY VENOUS DOPPLER ULTRASOUND TECHNIQUE: Gray-scale sonography with graded compression, as well as color Doppler and duplex ultrasound were performed to evaluate the lower extremity deep venous systems from the level of the common femoral vein and including the common femoral, femoral, profunda femoral, popliteal and calf veins including the posterior tibial, peroneal and gastrocnemius veins when visible. The superficial great saphenous vein was also interrogated. Spectral Doppler was utilized to evaluate flow at rest and with distal augmentation maneuvers in the common femoral, femoral and popliteal veins. COMPARISON:  None. FINDINGS: Contralateral Common Femoral Vein: Respiratory phasicity is normal and symmetric with the symptomatic side. No evidence  of thrombus. Normal compressibility. Common Femoral Vein: Hypoechoic intraluminal thrombus. Vessel is partially compressible. Thrombus is nonocclusive. Saphenofemoral Junction: No evidence of thrombus. Normal compressibility and flow on color Doppler imaging. Profunda Femoral Vein: Hypoechoic intraluminal thrombus. Vessel is partially compressible. Thrombus is nonocclusive. Femoral Vein: Diffuse hypoechoic thrombus throughout. Vessel is noncompressible. Thrombus is occlusive Popliteal Vein: Similar hypoechoic thrombus throughout. Vessel is noncompressible. Thrombus is occlusive Calf Veins: Occlusive thrombus extends into the calf tibial and peroneal veins. IMPRESSION:  Positive exam for extensive left lower extremity femoropopliteal acute occlusive DVT extending into the calf veins. Electronically Signed   By: Judie Petit.  Shick M.D.   On: 03/15/2020 11:02   DG Chest Portable 1 View  Result Date: 03/15/2020 CLINICAL DATA:  Preoperative assessment EXAM: PORTABLE CHEST 1 VIEW COMPARISON:  None. FINDINGS: Lungs are clear. Heart size and pulmonary vascularity are normal. No adenopathy. There is upper thoracic levoscoliosis. IMPRESSION: No edema or airspace opacity.  Cardiac silhouette normal. Electronically Signed   By: Bretta Bang III M.D.   On: 03/15/2020 12:55    Microbiology: Recent Results (from the past 240 hour(Garza))  SARS Coronavirus 2 by RT PCR (hospital order, performed in [facility] hospital lab) Nasopharyngeal Nasopharyngeal Swab     Status: None   Collection Time: 03/15/20 12:09 PM   Specimen: Nasopharyngeal Swab  Result Value Ref Range Status   SARS Coronavirus 2 NEGATIVE NEGATIVE Final    Comment: (NOTE) SARS-CoV-2 target nucleic acids are NOT DETECTED.  The SARS-CoV-2 RNA is generally detectable in upper and lower respiratory specimens during the acute phase of infection. The lowest concentration of SARS-CoV-2 viral copies this assay can detect is 250 copies / mL. A negative result does not preclude SARS-CoV-2 infection and should not be used as the sole basis for treatment or other patient management decisions.  A negative result may occur with improper specimen collection / handling, submission of specimen other than nasopharyngeal swab, presence of viral mutation(Garza) within the areas targeted by this assay, and inadequate number of viral copies (<250 copies / mL). A negative result must be combined with clinical observations, patient history, and epidemiological information.  Fact Sheet for Patients:   BoilerBrush.com.cy  Fact Sheet for Healthcare Providers: https://pope.com/  This test is  not yet approved or  cleared by the [place] FDA and has been authorized for detection and/or diagnosis of SARS-CoV-2 by FDA under an Emergency Use Authorization (EUA).  This EUA will remain in effect (meaning this test can be used) for the duration of the COVID-19 declaration under Section 564(b)(1) of the Act, 21 U.Gar[place].C. section 360bbb-3(b)(1), unless the authorization is terminated or revoked sooner.  Performed at [facility], [address], [place], [place] [address]   Urine Culture     Status: None   Collection Time: 03/17/20  8:21 AM   Specimen: Urine, Random  Result Value Ref Range Status   Specimen Description   Final    URINE, RANDOM Performed at [facility], [address], [place], [place] [address]    Special Requests   Final    NONE Performed at [facility], [address], [place], [place] [address]    Culture   Final    NO GROWTH Performed at [facility], [address], [place], [place] [address]    Report Status 03/18/2020 FINAL  Final     Labs: Basic Metabolic Panel: Recent Labs  Lab 03/15/20 1208 03/16/20 0547 03/17/20 0506 03/18/20 0608  NA 141 141  --  142  K 3.7 4.0  --  3.6  CL 104 107  --  109  CO2 24 27  --  26  GLUCOSE 100* 102*  --  107*  BUN 24* 21  --  13  CREATININE 1.47* 1.34* 1.14 1.26*  CALCIUM 9.6 8.8*  --  8.8*  MG  --  2.1  --   --    Liver Function Tests: Recent Labs  Lab 03/15/20 1208  AST 19  ALT 16  ALKPHOS 62  BILITOT 0.7  PROT 7.5  ALBUMIN 4.2   No results for input(Garza): LIPASE, AMYLASE in the last 168 hours. No results for input(Garza): AMMONIA in the last 168 hours. CBC: Recent Labs  Lab 03/15/20 1208 03/16/20 0547 03/17/20 0506 03/18/20 0608  WBC 8.1 5.8 5.9 5.6  NEUTROABS 4.9  --   --   --   HGB 15.7 13.2 13.1 12.3*  HCT 45.1 38.5* 37.8* 35.0*  MCV 86.2 88.3 88.3 86.8  PLT 199 183 166 163   Cardiac Enzymes: No results for input(Garza): CKTOTAL, CKMB, CKMBINDEX,  TROPONINI in the last 168 hours. BNP: BNP (last 3 results) No results for input(Garza): BNP in the last 8760 hours.  ProBNP (last 3 results) No results for input(Garza): PROBNP in the last 8760 hours.  CBG: No results for input(Garza): GLUCAP in the last 168 hours.  Principal Problem:   DVT (deep venous thrombosis) (HCC)   Time coordinating discharge: 38 minutes.  Signed:        Aundrea Horace, DO [facility]  03/18/2020, 6:40 PM

## 2020-03-18 NOTE — Care Management Important Message (Signed)
Important Message  Patient Details  Name: Bryan Garza MRN: [id number] Date of Birth: 06/02/1934   Medicare Important Message Given:  Yes     [name] [name] 03/18/2020, 10:42 AM

## 2020-03-31 DIAGNOSIS — Z1211 Encounter for screening for malignant neoplasm of colon: Secondary | ICD-10-CM | POA: Diagnosis not present

## 2020-03-31 DIAGNOSIS — I824Y2 Acute embolism and thrombosis of unspecified deep veins of left proximal lower extremity: Secondary | ICD-10-CM | POA: Diagnosis not present

## 2020-03-31 DIAGNOSIS — Z1212 Encounter for screening for malignant neoplasm of rectum: Secondary | ICD-10-CM | POA: Diagnosis not present

## 2020-03-31 DIAGNOSIS — Z125 Encounter for screening for malignant neoplasm of prostate: Secondary | ICD-10-CM | POA: Diagnosis not present

## 2020-03-31 DIAGNOSIS — Z1159 Encounter for screening for other viral diseases: Secondary | ICD-10-CM | POA: Diagnosis not present

## 2020-03-31 DIAGNOSIS — N1831 Chronic kidney disease, stage 3a: Secondary | ICD-10-CM | POA: Diagnosis not present

## 2020-04-14 ENCOUNTER — Other Ambulatory Visit (INDEPENDENT_AMBULATORY_CARE_PROVIDER_SITE_OTHER): Payer: Self-pay | Admitting: Vascular Surgery

## 2020-04-14 DIAGNOSIS — Z9862 Peripheral vascular angioplasty status: Secondary | ICD-10-CM

## 2020-04-14 DIAGNOSIS — I82412 Acute embolism and thrombosis of left femoral vein: Secondary | ICD-10-CM

## 2020-04-15 ENCOUNTER — Ambulatory Visit (INDEPENDENT_AMBULATORY_CARE_PROVIDER_SITE_OTHER): Payer: PPO

## 2020-04-15 ENCOUNTER — Other Ambulatory Visit: Payer: Self-pay

## 2020-04-15 ENCOUNTER — Ambulatory Visit (INDEPENDENT_AMBULATORY_CARE_PROVIDER_SITE_OTHER): Payer: PPO | Admitting: Nurse Practitioner

## 2020-04-15 ENCOUNTER — Encounter (INDEPENDENT_AMBULATORY_CARE_PROVIDER_SITE_OTHER): Payer: Self-pay | Admitting: Nurse Practitioner

## 2020-04-15 VITALS — BP 133/75 | HR 76 | Resp 17 | Ht 66.0 in | Wt 161.0 lb

## 2020-04-15 DIAGNOSIS — I82412 Acute embolism and thrombosis of left femoral vein: Secondary | ICD-10-CM

## 2020-04-15 DIAGNOSIS — Z9862 Peripheral vascular angioplasty status: Secondary | ICD-10-CM

## 2020-04-15 MED ORDER — APIXABAN 5 MG PO TABS: 5.0000 mg | ORAL_TABLET | Freq: Two times a day (BID) | ORAL | 5 refills | Status: DC

## 2020-04-18 ENCOUNTER — Encounter (INDEPENDENT_AMBULATORY_CARE_PROVIDER_SITE_OTHER): Payer: Self-pay | Admitting: Nurse Practitioner

## 2020-04-18 NOTE — Progress Notes (Signed)
Subjective:    Patient ID: Bryan Garza, male    DOB: 12-07-1933, 86 y.o.   MRN: [id number] Chief Complaint  Patient presents with  . Follow-up    The patient presents today following a left lower extremity thrombectomy on 03/16/2020.  The patient underwent the following intervention:  PROCEDURE: 1. US guidance for vascular access to left popliteal vein 2. Catheter placement into left external iliac vein from left popliteal approach 3. IVC gram and left lower extremity venogram 4.   Catheter directed thrombolysis with 8 mg of TPA to the left common femoral vein, superficial femoral vein, and popliteal vein 5. Mechanical thrombectomy to the left popliteal vein, superficial femoral vein, and common femoral vein with the penumbra CAT 12 device 6. PTA of left superficial femoral vein and common femoral vein with 8 mm balloon 7.   PTA of the left external iliac vein with 8 mm balloon  The patient's daughter is present today and provides much of the history.  The patient does participate in history however is easily confused at times.  The patient's daughter notes that the patient had sudden swelling of his left lower extremity and it was subsequently found to be a DVT.  The swelling was very painful and sore to her father.  However following thrombectomy, his left lower extremity feels much improved.  The patient has been utilizing medical grade 1 compression stockings diligently and denies any significant pain.  The only note is that the leg continues to be warm, but there is no ulceration.  He denies any fever, chills, nausea, vomiting or diarrhea.  The daughter denies any known provoking factors such as immobility, trauma or hypercoagulable factors.  There is no family history of DVT.   Review of Systems  Cardiovascular: Positive for leg swelling.  Musculoskeletal: Positive for gait problem.  Hematological: Bruises/bleeds easily.  All other systems reviewed and are  negative.      Objective:   Physical Exam Vitals reviewed.  HENT:     Head: Normocephalic.  Cardiovascular:     Rate and Rhythm: Normal rate and regular rhythm.     Pulses: Normal pulses.     Heart sounds: Normal heart sounds.  Pulmonary:     Effort: Pulmonary effort is normal.     Breath sounds: Normal breath sounds.  Musculoskeletal:     Left lower leg: Edema present.  Skin:    General: Skin is warm and dry.  Neurological:     Mental Status: He is alert and oriented to person, place, and time.  Psychiatric:        Mood and Affect: Mood normal.        Behavior: Behavior normal.        Thought Content: Thought content normal.        Judgment: Judgment normal.     BP (!) 133/75 (BP Location: Right Arm)   Pulse 76   Resp 17   Ht 5\' 6"  (1.676 m)   Wt 161 lb (73 kg)   BMI 25.99 kg/m   Past Medical History:  Diagnosis Date  . Arthritis     Social History   Socioeconomic History  . Marital status: Married    Spouse name: Not on file  . Number of children: Not on file  . Years of education: Not on file  . Highest education level: Not on file  Occupational History  . Not on file  Tobacco Use  . Smoking status: Former  .  Smokeless tobacco: Never Used  Substance and Sexual Activity  . Alcohol use: No  . Drug use: Never  . Sexual activity: Not on file  Other Topics Concern  . Not on file  Social History Narrative  . Not on file   Social Determinants of Health   Financial Resource Strain:   . Difficulty of Paying Living Expenses:   Food Insecurity:   . Worried About Programme researcher, broadcasting/film/video in the Last Year:   . Barista in the Last Year:   Transportation Needs:   . Freight forwarder (Medical):   Marland Kitchen Lack of Transportation (Non-Medical):   Physical Activity:   . Days of Exercise per Week:   . Minutes of Exercise per Session:   Stress:   . Feeling of Stress :   Social Connections:   . Frequency of Communication with Friends and Family:    . Frequency of Social Gatherings with Friends and Family:   . Attends Religious Services:   . Active Member of Clubs or Organizations:   . Attends Banker Meetings:   Marland Kitchen Marital Status:   Intimate Partner Violence:   . Fear of Current or Ex-Partner:   . Emotionally Abused:   Marland Kitchen Physically Abused:   . Sexually Abused:     Past Surgical History:  Procedure Laterality Date  . MINOR HEMORRHOIDECTOMY    . PERIPHERAL VASCULAR THROMBECTOMY Left 03/16/2020   Procedure: PERIPHERAL VASCULAR THROMBECTOMY;  Surgeon: Annice Needy, MD;  Location: [facility] INVASIVE CV LAB;  Service: Cardiovascular;  Laterality: Left;    History reviewed. No pertinent family history.  Allergies  Allergen Reactions  . Tetracyclines & Related Nausea And Vomiting    Flu symptoms       Assessment & Plan:   1. Acute deep vein thrombosis (DVT) of femoral vein of left lower extremity (HCC) Currently the patient is doing well post thrombectomy.  The patient will continue with Eliquis as prescribed.  Based upon the daughter's history this largely seems to be an unprovoked DVT.  We will plan to continue with anticoagulation for at least 6 months, unless any major bleeding issues arise.  At this time the patient denies any issues with bleeding.  We will have the patient follow-up in 6 months with noninvasive studies to establish new baseline of the patient's left lower extremity in addition to discussing options going for her anticoagulation.  Patient is advised to contact her office if they have questions or concerns.   Current Outpatient Medications on File Prior to Visit  Medication Sig Dispense Refill  . Ashwagandha 125 MG CAPS Take 1 capsule by mouth daily.    Marland Kitchen guaiFENesin (MUCINEX) 600 MG 12 hr tablet Take by mouth.    . Misc Natural Products (HERBAL ENERGY COMPLEX PO) Take by mouth.    . Multiple Vitamin (MULTIVITAMIN WITH MINERALS) TABS tablet Take 1 tablet by mouth daily.    . Omega-3 Fatty Acids  (OMEGA-3 FISH OIL) 300 MG CAPS Take by mouth.    Marland Kitchen HYDROcodone-acetaminophen (NORCO/VICODIN) 5-325 MG tablet Take 1 tablet by mouth every 6 (six) hours as needed for moderate pain or severe pain. (Patient not taking: Reported on 04/15/2020) 30 tablet 0   No current facility-administered medications on file prior to visit.    There are no Patient Instructions on file for this visit. No follow-ups on file.   Georgiana Spinner, NP

## 2020-08-26 DIAGNOSIS — N1831 Chronic kidney disease, stage 3a: Secondary | ICD-10-CM | POA: Diagnosis not present

## 2020-08-26 DIAGNOSIS — N401 Enlarged prostate with lower urinary tract symptoms: Secondary | ICD-10-CM | POA: Diagnosis not present

## 2020-08-26 DIAGNOSIS — Z1211 Encounter for screening for malignant neoplasm of colon: Secondary | ICD-10-CM | POA: Diagnosis not present

## 2020-08-26 DIAGNOSIS — I824Y2 Acute embolism and thrombosis of unspecified deep veins of left proximal lower extremity: Secondary | ICD-10-CM | POA: Diagnosis not present

## 2020-08-26 DIAGNOSIS — Z1212 Encounter for screening for malignant neoplasm of rectum: Secondary | ICD-10-CM | POA: Diagnosis not present

## 2020-08-26 DIAGNOSIS — Z0000 Encounter for general adult medical examination without abnormal findings: Secondary | ICD-10-CM | POA: Diagnosis not present

## 2020-08-26 DIAGNOSIS — R351 Nocturia: Secondary | ICD-10-CM | POA: Diagnosis not present

## 2020-09-29 ENCOUNTER — Other Ambulatory Visit (INDEPENDENT_AMBULATORY_CARE_PROVIDER_SITE_OTHER): Payer: Self-pay | Admitting: Nurse Practitioner

## 2020-09-29 DIAGNOSIS — I82412 Acute embolism and thrombosis of left femoral vein: Secondary | ICD-10-CM

## 2020-09-30 ENCOUNTER — Other Ambulatory Visit: Payer: Self-pay

## 2020-09-30 ENCOUNTER — Encounter (INDEPENDENT_AMBULATORY_CARE_PROVIDER_SITE_OTHER): Payer: PPO

## 2020-09-30 ENCOUNTER — Ambulatory Visit (INDEPENDENT_AMBULATORY_CARE_PROVIDER_SITE_OTHER): Payer: PPO | Admitting: Vascular Surgery

## 2020-09-30 ENCOUNTER — Ambulatory Visit (INDEPENDENT_AMBULATORY_CARE_PROVIDER_SITE_OTHER): Payer: PPO

## 2020-09-30 ENCOUNTER — Encounter (INDEPENDENT_AMBULATORY_CARE_PROVIDER_SITE_OTHER): Payer: Self-pay | Admitting: Vascular Surgery

## 2020-09-30 VITALS — BP 130/76 | HR 83 | Resp 16 | Wt 161.8 lb

## 2020-09-30 DIAGNOSIS — I82512 Chronic embolism and thrombosis of left femoral vein: Secondary | ICD-10-CM | POA: Diagnosis not present

## 2020-09-30 DIAGNOSIS — I82412 Acute embolism and thrombosis of left femoral vein: Secondary | ICD-10-CM | POA: Diagnosis not present

## 2020-09-30 MED ORDER — APIXABAN 2.5 MG PO TABS
2.5000 mg | ORAL_TABLET | Freq: Two times a day (BID) | ORAL | 1 refills | Status: DC
Start: 2020-09-30 — End: 2022-05-31

## 2020-09-30 NOTE — Assessment & Plan Note (Signed)
His duplex today shows only a small amount of residual chronic thrombus in the popliteal and femoral veins improved from previous study. We discussed 3 options for treatment going forward.  One would be continued full anticoagulation as current.  The second option would be stopping anticoagulation going to aspirin.  The third option which has from a sitting preliminary data would be using a lower dose of anticoagulation going forward.  This appears to significantly reduce the risk of recurrent DVT with no sniffing increase in bleeding risk over aspirin alone.  We will go to 2.5 mg of Eliquis at this point.  I will see him back in 6 months.

## 2020-09-30 NOTE — Progress Notes (Signed)
MRN : [id number]  Bryan Garza [name] is a 87 y.o. (1934-01-10) male who presents with chief complaint of  Chief Complaint  Patient presents with  . Follow-up    60month ultrasound follow up  .  History of Present Illness: Patient returns today in follow up of his left leg DVT.  He underwent intervention about 6 to 7 months ago and has been maintained on anticoagulation since that time.  He is doing well.  He has really no leg pain or swelling to speak of at this point.  He denies any chest pain or shortness of breath.  His duplex today shows only a small amount of residual chronic thrombus in the popliteal and femoral veins improved from previous study.  Current Outpatient Medications  Medication Sig Dispense Refill  . apixaban (ELIQUIS) 2.5 MG TABS tablet Take 1 tablet (2.5 mg total) by mouth 2 (two) times daily. 60 tablet 1  . Ashwagandha 125 MG CAPS Take 1 capsule by mouth daily.    Marland Kitchen guaiFENesin (MUCINEX) 600 MG 12 hr tablet Take by mouth.    . Misc Natural Products (HERBAL ENERGY COMPLEX PO) Take by mouth.    . Multiple Vitamin (MULTIVITAMIN WITH MINERALS) TABS tablet Take 1 tablet by mouth daily.    . Omega-3 Fatty Acids (OMEGA-3 FISH OIL) 300 MG CAPS Take by mouth.    Marland Kitchen HYDROcodone-acetaminophen (NORCO/VICODIN) 5-325 MG tablet Take 1 tablet by mouth every 6 (six) hours as needed for moderate pain or severe pain. (Patient not taking: No sig reported) 30 tablet 0   No current facility-administered medications for this visit.    Past Medical History:  Diagnosis Date  . Arthritis     Past Surgical History:  Procedure Laterality Date  . MINOR HEMORRHOIDECTOMY    . PERIPHERAL VASCULAR THROMBECTOMY Left 03/16/2020   Procedure: PERIPHERAL VASCULAR THROMBECTOMY;  Surgeon: Annice Needy, MD;  Location: [facility];  Service: Cardiovascular;  Laterality: Left;     Social History   Tobacco Use  . Smoking status: Former Games developer  . Smokeless tobacco: Never Used  Substance  Use Topics  . Alcohol use: No  . Drug use: Never      No family history on file.   Allergies  Allergen Reactions  . Tetracyclines & Related Nausea And Vomiting    Flu symptoms     REVIEW OF SYSTEMS (Negative unless checked)  Constitutional: [] Weight loss  [] Fever  [] Chills Cardiac: [] Chest pain   [] Chest pressure   [] Palpitations   [] Shortness of breath when laying flat   [] Shortness of breath at rest   [] Shortness of breath with exertion. Vascular:  [] Pain in legs with walking   [] Pain in legs at rest   [] Pain in legs when laying flat   [] Claudication   [] Pain in feet when walking  [] Pain in feet at rest  [] Pain in feet when laying flat   [x] History of DVT   [x] Phlebitis   [] Swelling in legs   [] Varicose veins   [] Non-healing ulcers Pulmonary:   [] Uses home oxygen   [] Productive cough   [] Hemoptysis   [] Wheeze  [] COPD   [] Asthma Neurologic:  [] Dizziness  [] Blackouts   [] Seizures   [] History of stroke   [] History of TIA  [] Aphasia   [] Temporary blindness   [] Dysphagia   [] Weakness or numbness in arms   [] Weakness or numbness in legs Musculoskeletal:  [x] Arthritis   [] Joint swelling   [] Joint pain   [] Low back pain Hematologic:  []   Easy bruising  [] Easy bleeding   [] Hypercoagulable state   [] Anemic   Gastrointestinal:  [] Blood in stool   [] Vomiting blood  [] Gastroesophageal reflux/heartburn   [] Abdominal pain Genitourinary:  [] Chronic kidney disease   [] Difficult urination  [] Frequent urination  [] Burning with urination   [] Hematuria Skin:  [] Rashes   [] Ulcers   [] Wounds Psychological:  [] History of anxiety   []  History of major depression.  Physical Examination  BP 130/76 (BP Location: Right Arm)   Pulse 83   Resp 16   Wt 161 lb 12.8 oz (73.4 kg)   BMI 26.12 kg/m  Gen:  WD/WN, NAD.  Appears younger than stated age Head: [place]/AT, No temporalis wasting. Ear/Nose/Throat: Hearing grossly intact, nares w/o erythema or drainage Eyes: Conjunctiva clear. Sclera non-icteric Neck:  Supple.  Trachea midline Pulmonary:  Good air movement, no use of accessory muscles.  Cardiac: RRR, no JVD Vascular:  Vessel Right Left  Radial Palpable Palpable                          PT Palpable Palpable  DP Palpable Palpable   Gastrointestinal: soft, non-tender/non-distended. No guarding/reflex.  Musculoskeletal: M/S 5/5 throughout.  No deformity or atrophy.  No edema. Neurologic: Sensation grossly intact in extremities.  Symmetrical.  Speech is fluent.  Psychiatric: Judgment intact, Mood & affect appropriate for pt's clinical situation. Dermatologic: No rashes or ulcers noted.  No cellulitis or open wounds.       Labs No results found for this or any previous visit (from the past 2160 hour(s)).  Radiology No results found.  Assessment/Plan  DVT (deep venous thrombosis) (HCC) His duplex today shows only a small amount of residual chronic thrombus in the popliteal and femoral veins improved from previous study. We discussed 3 options for treatment going forward.  One would be continued full anticoagulation as current.  The second option would be stopping anticoagulation going to aspirin.  The third option which has from a sitting preliminary data would be using a lower dose of anticoagulation going forward.  This appears to significantly reduce the risk of recurrent DVT with no sniffing increase in bleeding risk over aspirin alone.  We will go to 2.5 mg of Eliquis at this point.  I will see him back in 6 months.    , MD  09/30/2020 10:23 AM    This note was created with Dragon medical transcription system.  Any errors from dictation are purely unintentional

## 2020-10-05 ENCOUNTER — Other Ambulatory Visit: Payer: Self-pay | Admitting: Infectious Diseases

## 2020-10-05 DIAGNOSIS — R202 Paresthesia of skin: Secondary | ICD-10-CM | POA: Diagnosis not present

## 2020-10-05 DIAGNOSIS — N401 Enlarged prostate with lower urinary tract symptoms: Secondary | ICD-10-CM | POA: Diagnosis not present

## 2020-10-05 DIAGNOSIS — R351 Nocturia: Secondary | ICD-10-CM | POA: Diagnosis not present

## 2020-10-05 DIAGNOSIS — Z1211 Encounter for screening for malignant neoplasm of colon: Secondary | ICD-10-CM | POA: Diagnosis not present

## 2020-10-05 DIAGNOSIS — I824Y2 Acute embolism and thrombosis of unspecified deep veins of left proximal lower extremity: Secondary | ICD-10-CM | POA: Diagnosis not present

## 2020-10-05 DIAGNOSIS — R413 Other amnesia: Secondary | ICD-10-CM | POA: Diagnosis not present

## 2020-10-05 DIAGNOSIS — Z1212 Encounter for screening for malignant neoplasm of rectum: Secondary | ICD-10-CM | POA: Diagnosis not present

## 2020-10-11 ENCOUNTER — Ambulatory Visit
Admission: RE | Admit: 2020-10-11 | Discharge: 2020-10-11 | Disposition: A | Discharge status: Home / Self Care | Payer: PPO | Source: Ambulatory Visit | Attending: Infectious Diseases | Admitting: Infectious Diseases

## 2020-10-11 ENCOUNTER — Other Ambulatory Visit: Payer: Self-pay

## 2020-10-11 DIAGNOSIS — J0120 Acute ethmoidal sinusitis, unspecified: Secondary | ICD-10-CM | POA: Diagnosis not present

## 2020-10-11 DIAGNOSIS — R410 Disorientation, unspecified: Secondary | ICD-10-CM | POA: Diagnosis not present

## 2020-10-11 DIAGNOSIS — R413 Other amnesia: Secondary | ICD-10-CM | POA: Diagnosis not present

## 2020-10-11 DIAGNOSIS — I739 Peripheral vascular disease, unspecified: Secondary | ICD-10-CM | POA: Diagnosis not present

## 2020-10-11 DIAGNOSIS — G319 Degenerative disease of nervous system, unspecified: Secondary | ICD-10-CM | POA: Diagnosis not present

## 2020-10-20 ENCOUNTER — Other Ambulatory Visit (INDEPENDENT_AMBULATORY_CARE_PROVIDER_SITE_OTHER): Payer: Self-pay | Admitting: Nurse Practitioner

## 2020-10-20 DIAGNOSIS — K219 Gastro-esophageal reflux disease without esophagitis: Secondary | ICD-10-CM | POA: Diagnosis not present

## 2020-10-20 DIAGNOSIS — R1013 Epigastric pain: Secondary | ICD-10-CM | POA: Diagnosis not present

## 2020-11-02 DIAGNOSIS — K219 Gastro-esophageal reflux disease without esophagitis: Secondary | ICD-10-CM | POA: Diagnosis not present

## 2020-11-02 DIAGNOSIS — F0390 Unspecified dementia without behavioral disturbance: Secondary | ICD-10-CM | POA: Diagnosis not present

## 2020-11-02 DIAGNOSIS — Z0000 Encounter for general adult medical examination without abnormal findings: Secondary | ICD-10-CM | POA: Diagnosis not present

## 2020-11-02 DIAGNOSIS — R351 Nocturia: Secondary | ICD-10-CM | POA: Diagnosis not present

## 2020-11-02 DIAGNOSIS — N401 Enlarged prostate with lower urinary tract symptoms: Secondary | ICD-10-CM | POA: Diagnosis not present

## 2020-12-05 ENCOUNTER — Telehealth (INDEPENDENT_AMBULATORY_CARE_PROVIDER_SITE_OTHER): Payer: Self-pay

## 2020-12-05 NOTE — Telephone Encounter (Signed)
Patient daughter called to informed that patient has switch pharmacies and will be receiving his medications at [facility]. The patient daughter requested for Eliquis 2.5 mg bid to called into [facility] with refills. Medication refill has been called into pharmacy with 4 refills until next follow up in the office.

## 2021-02-16 DIAGNOSIS — F0390 Unspecified dementia without behavioral disturbance: Secondary | ICD-10-CM | POA: Diagnosis not present

## 2021-02-16 DIAGNOSIS — K219 Gastro-esophageal reflux disease without esophagitis: Secondary | ICD-10-CM | POA: Diagnosis not present

## 2021-02-16 DIAGNOSIS — N1831 Chronic kidney disease, stage 3a: Secondary | ICD-10-CM | POA: Diagnosis not present

## 2021-02-16 DIAGNOSIS — N401 Enlarged prostate with lower urinary tract symptoms: Secondary | ICD-10-CM | POA: Diagnosis not present

## 2021-02-16 DIAGNOSIS — R351 Nocturia: Secondary | ICD-10-CM | POA: Diagnosis not present

## 2021-02-16 DIAGNOSIS — I824Y2 Acute embolism and thrombosis of unspecified deep veins of left proximal lower extremity: Secondary | ICD-10-CM | POA: Diagnosis not present

## 2021-03-28 ENCOUNTER — Ambulatory Visit (INDEPENDENT_AMBULATORY_CARE_PROVIDER_SITE_OTHER): Payer: PPO | Admitting: Vascular Surgery

## 2021-03-28 ENCOUNTER — Other Ambulatory Visit: Payer: Self-pay

## 2021-03-28 ENCOUNTER — Encounter (INDEPENDENT_AMBULATORY_CARE_PROVIDER_SITE_OTHER): Payer: Self-pay | Admitting: Vascular Surgery

## 2021-03-28 VITALS — BP 152/77 | HR 65 | Resp 16 | Wt 172.4 lb

## 2021-03-28 DIAGNOSIS — N1831 Chronic kidney disease, stage 3a: Secondary | ICD-10-CM | POA: Insufficient documentation

## 2021-03-28 DIAGNOSIS — I82512 Chronic embolism and thrombosis of left femoral vein: Secondary | ICD-10-CM | POA: Diagnosis not present

## 2021-03-28 DIAGNOSIS — R351 Nocturia: Secondary | ICD-10-CM | POA: Diagnosis not present

## 2021-03-28 DIAGNOSIS — R202 Paresthesia of skin: Secondary | ICD-10-CM | POA: Insufficient documentation

## 2021-03-28 DIAGNOSIS — F0390 Unspecified dementia without behavioral disturbance: Secondary | ICD-10-CM | POA: Diagnosis not present

## 2021-03-28 DIAGNOSIS — K219 Gastro-esophageal reflux disease without esophagitis: Secondary | ICD-10-CM | POA: Diagnosis not present

## 2021-03-28 DIAGNOSIS — R413 Other amnesia: Secondary | ICD-10-CM | POA: Diagnosis not present

## 2021-03-28 DIAGNOSIS — N401 Enlarged prostate with lower urinary tract symptoms: Secondary | ICD-10-CM | POA: Diagnosis not present

## 2021-03-28 NOTE — Assessment & Plan Note (Signed)
The patient is previously had thrombectomy for extensive left lower extremity DVT.  After thrombectomy and with appropriate conservative therapies of compression socks and elevation, he is doing well with essentially no pain or swelling in his legs.  Were going to continue the 2.5 mg twice daily of Eliquis which she seems to be tolerating well.  We can see him back in 1 year.

## 2021-03-28 NOTE — Progress Notes (Signed)
MRN : [id number]  Bryan Garza Bryan Garza is a 87 y.o. (November 29, 1933) male who presents with chief complaint of  Chief Complaint  Patient presents with   Follow-up    6 month follow up  .  History of Present Illness: Patient returns today in follow up of his previous DVT.  About a year ago, he had thrombectomy for an extensive left lower extremity DVT.  He is doing well.  He is tolerating anticoagulation.  At his last visit, we reduced the dose to 2.5 mg twice daily.  No signs of bleeding.  He is having essentially no pain or swelling in his legs at this point.  He does wear his compression stockings daily.  His daughter provides much of the history and it is clear his cognitive status is declining.  Current Outpatient Medications  Medication Sig Dispense Refill   apixaban (ELIQUIS) 2.5 MG TABS tablet Take 1 tablet (2.5 mg total) by mouth 2 (two) times daily. 60 tablet 1   donepezil (ARICEPT) 5 MG tablet Take 5 mg by mouth at bedtime.     famotidine (PEPCID) 40 MG tablet Take 40 mg by mouth at bedtime.     finasteride (PROSCAR) 5 MG tablet Take 5 mg by mouth daily.     guaiFENesin (MUCINEX) 600 MG 12 hr tablet Take by mouth.     Multiple Vitamin (MULTIVITAMIN WITH MINERALS) TABS tablet Take 1 tablet by mouth daily.     Omega-3 Fatty Acids (OMEGA-3 FISH OIL) 300 MG CAPS Take by mouth.     omeprazole (PRILOSEC) 40 MG capsule Take 40 mg by mouth daily.     tamsulosin (FLOMAX) 0.4 MG CAPS capsule TAKE 2 CAPSULES BY MOUTH ONCE DAILY. TAKE 30 MINUTES AFTER SAME MEAL EACH DAY.     traZODone (DESYREL) 50 MG tablet Take 50 mg by mouth at bedtime.     Ashwagandha 125 MG CAPS Take 1 capsule by mouth daily. (Patient not taking: Reported on 03/28/2021)     HYDROcodone-acetaminophen (NORCO/VICODIN) 5-325 MG tablet Take 1 tablet by mouth every 6 (six) hours as needed for moderate pain or severe pain. (Patient not taking: No sig reported) 30 tablet 0   Misc Natural Products (HERBAL ENERGY COMPLEX PO) Take by  mouth. (Patient not taking: Reported on 03/28/2021)     No current facility-administered medications for this visit.    Past Medical History:  Diagnosis Date   Arthritis     Past Surgical History:  Procedure Laterality Date   MINOR HEMORRHOIDECTOMY     PERIPHERAL VASCULAR THROMBECTOMY Left 03/16/2020   Procedure: PERIPHERAL VASCULAR THROMBECTOMY;  Surgeon: Annice Needy, MD;  Location: [facility];  Service: Cardiovascular;  Laterality: Left;     Social History   Tobacco Use   Smoking status: Former    Pack years: 0.00   Smokeless tobacco: Never  Substance Use Topics   Alcohol use: No   Drug use: Never      Family History  Problem Relation Age of Onset   Lung cancer Father      Allergies  Allergen Reactions   Tetracyclines & Related Nausea And Vomiting    Flu symptoms     REVIEW OF SYSTEMS (Negative unless checked)   Constitutional: [] Weight loss  [] Fever  [] Chills Cardiac: [] Chest pain   [] Chest pressure   [] Palpitations   [] Shortness of breath when laying flat   [] Shortness of breath at rest   [] Shortness of breath with exertion. Vascular:  [] Pain in  legs with walking   [] Pain in legs at rest   [] Pain in legs when laying flat   [] Claudication   [] Pain in feet when walking  [] Pain in feet at rest  [] Pain in feet when laying flat   [x] History of DVT   [x] Phlebitis   [] Swelling in legs   [] Varicose veins   [] Non-healing ulcers Pulmonary:   [] Uses home oxygen   [] Productive cough   [] Hemoptysis   [] Wheeze  [] COPD   [] Asthma Neurologic:  [] Dizziness  [] Blackouts   [] Seizures   [] History of stroke   [] History of TIA  [] Aphasia   [] Temporary blindness   [] Dysphagia   [] Weakness or numbness in arms   [] Weakness or numbness in legs Musculoskeletal:  [x] Arthritis   [] Joint swelling   [] Joint pain   [] Low back pain Hematologic:  [] Easy bruising  [] Easy bleeding   [] Hypercoagulable state   [] Anemic   Gastrointestinal:  [] Blood in stool   [] Vomiting blood   [] Gastroesophageal reflux/heartburn   [] Abdominal pain Genitourinary:  [] Chronic kidney disease   [] Difficult urination  [] Frequent urination  [] Burning with urination   [] Hematuria Skin:  [] Rashes   [] Ulcers   [] Wounds Psychological:  [] History of anxiety   []  History of major depression.  Physical Examination  BP (!) 152/77 (BP Location: Right Arm)   Pulse 65   Resp 16   Wt 172 lb 6.4 oz (78.2 kg)   BMI 27.83 kg/m  Gen:  WD/WN, NAD Head: [place]/AT, No temporalis wasting. Ear/Nose/Throat: Hearing grossly intact, nares w/o erythema or drainage Eyes: Conjunctiva clear. Sclera non-icteric Neck: Supple.  Trachea midline Pulmonary:  Good air movement, no use of accessory muscles.  Cardiac: RRR, no JVD Vascular:  Vessel Right Left  Radial Palpable Palpable                          PT Palpable Palpable  DP Palpable Palpable   Musculoskeletal: M/S 5/5 throughout.  No deformity or atrophy.  No lower extremity edema. Neurologic: Sensation grossly intact in extremities.  Symmetrical.  Speech is fluent.  Psychiatric: Judgment and insight appear fair at best Dermatologic: No rashes or ulcers noted.  No cellulitis or open wounds.      Labs No results found for this or any previous visit (from the past 2160 hour(s)).  Radiology No results found.  Assessment/Plan  DVT (deep venous thrombosis) (HCC) The patient is previously had thrombectomy for extensive left lower extremity DVT.  After thrombectomy and with appropriate conservative therapies of compression socks and elevation, he is doing well with essentially no pain or swelling in his legs.  Were going to continue the 2.5 mg twice daily of Eliquis which she seems to be tolerating well.  We can see him back in 1 year.    , MD  03/28/2021 11:28 AM    This note was created with Dragon medical transcription system.  Any errors from dictation are purely unintentional

## 2021-04-11 DIAGNOSIS — F0390 Unspecified dementia without behavioral disturbance: Secondary | ICD-10-CM | POA: Diagnosis not present

## 2021-04-11 DIAGNOSIS — R413 Other amnesia: Secondary | ICD-10-CM | POA: Diagnosis not present

## 2021-05-01 DIAGNOSIS — M1990 Unspecified osteoarthritis, unspecified site: Secondary | ICD-10-CM | POA: Insufficient documentation

## 2021-06-26 DIAGNOSIS — R351 Nocturia: Secondary | ICD-10-CM | POA: Diagnosis not present

## 2021-06-26 DIAGNOSIS — N401 Enlarged prostate with lower urinary tract symptoms: Secondary | ICD-10-CM | POA: Diagnosis not present

## 2021-06-26 DIAGNOSIS — N1831 Chronic kidney disease, stage 3a: Secondary | ICD-10-CM | POA: Diagnosis not present

## 2021-06-26 DIAGNOSIS — I824Y2 Acute embolism and thrombosis of unspecified deep veins of left proximal lower extremity: Secondary | ICD-10-CM | POA: Diagnosis not present

## 2021-06-26 DIAGNOSIS — K219 Gastro-esophageal reflux disease without esophagitis: Secondary | ICD-10-CM | POA: Diagnosis not present

## 2021-06-26 DIAGNOSIS — F0390 Unspecified dementia without behavioral disturbance: Secondary | ICD-10-CM | POA: Diagnosis not present

## 2021-07-03 DIAGNOSIS — F0390 Unspecified dementia without behavioral disturbance: Secondary | ICD-10-CM | POA: Diagnosis not present

## 2021-07-03 DIAGNOSIS — N1831 Chronic kidney disease, stage 3a: Secondary | ICD-10-CM | POA: Diagnosis not present

## 2021-07-03 DIAGNOSIS — R351 Nocturia: Secondary | ICD-10-CM | POA: Diagnosis not present

## 2021-07-03 DIAGNOSIS — R202 Paresthesia of skin: Secondary | ICD-10-CM | POA: Diagnosis not present

## 2021-07-03 DIAGNOSIS — K219 Gastro-esophageal reflux disease without esophagitis: Secondary | ICD-10-CM | POA: Diagnosis not present

## 2021-07-03 DIAGNOSIS — N401 Enlarged prostate with lower urinary tract symptoms: Secondary | ICD-10-CM | POA: Diagnosis not present

## 2022-01-01 DIAGNOSIS — R202 Paresthesia of skin: Secondary | ICD-10-CM | POA: Diagnosis not present

## 2022-01-01 DIAGNOSIS — R351 Nocturia: Secondary | ICD-10-CM | POA: Diagnosis not present

## 2022-01-01 DIAGNOSIS — F0390 Unspecified dementia without behavioral disturbance: Secondary | ICD-10-CM | POA: Diagnosis not present

## 2022-01-01 DIAGNOSIS — Z0000 Encounter for general adult medical examination without abnormal findings: Secondary | ICD-10-CM | POA: Diagnosis not present

## 2022-01-01 DIAGNOSIS — K219 Gastro-esophageal reflux disease without esophagitis: Secondary | ICD-10-CM | POA: Diagnosis not present

## 2022-01-01 DIAGNOSIS — I8290 Acute embolism and thrombosis of unspecified vein: Secondary | ICD-10-CM | POA: Diagnosis not present

## 2022-01-01 DIAGNOSIS — N1831 Chronic kidney disease, stage 3a: Secondary | ICD-10-CM | POA: Diagnosis not present

## 2022-01-01 DIAGNOSIS — Z7901 Long term (current) use of anticoagulants: Secondary | ICD-10-CM | POA: Diagnosis not present

## 2022-01-01 DIAGNOSIS — N401 Enlarged prostate with lower urinary tract symptoms: Secondary | ICD-10-CM | POA: Diagnosis not present

## 2022-02-10 ENCOUNTER — Emergency Department: Payer: PPO

## 2022-02-10 ENCOUNTER — Other Ambulatory Visit: Payer: Self-pay

## 2022-02-10 ENCOUNTER — Emergency Department
Admission: EM | Admit: 2022-02-10 | Discharge: 2022-02-10 | Disposition: A | Discharge status: Home / Self Care | Payer: PPO | Attending: Emergency Medicine | Admitting: Emergency Medicine

## 2022-02-10 ENCOUNTER — Encounter: Payer: Self-pay | Admitting: Emergency Medicine

## 2022-02-10 DIAGNOSIS — Z7901 Long term (current) use of anticoagulants: Secondary | ICD-10-CM | POA: Insufficient documentation

## 2022-02-10 DIAGNOSIS — L259 Unspecified contact dermatitis, unspecified cause: Secondary | ICD-10-CM | POA: Diagnosis not present

## 2022-02-10 DIAGNOSIS — N189 Chronic kidney disease, unspecified: Secondary | ICD-10-CM | POA: Insufficient documentation

## 2022-02-10 DIAGNOSIS — M7121 Synovial cyst of popliteal space [Baker], right knee: Secondary | ICD-10-CM | POA: Diagnosis not present

## 2022-02-10 DIAGNOSIS — M7989 Other specified soft tissue disorders: Secondary | ICD-10-CM | POA: Diagnosis not present

## 2022-02-10 DIAGNOSIS — M79661 Pain in right lower leg: Secondary | ICD-10-CM | POA: Diagnosis not present

## 2022-02-10 MED ORDER — TRIAMCINOLONE ACETONIDE 0.1 % EX CREA: 1.0000 "application " | TOPICAL_CREAM | Freq: Two times a day (BID) | CUTANEOUS | 0 refills | Status: DC

## 2022-02-10 NOTE — ED Provider Notes (Signed)
[facility] Provider Note    Event Date/Time   First MD Initiated Contact with Patient 02/10/22 [phone]     (approximate)   History   Leg Pain   HPI  Bryan Garza is a 88 y.o. male   presents to the ED with complaint of right calf pain that started during the night along with some itching to his posterior calf area.  Patient has had a history of DVTs and currently is taking Eliquis.  He denies any recent injuries.  No lengthy traveling.  Patient has history of chronic kidney disease, bilateral leg paresthesias, GERD, DVT, BPH.      Physical Exam   Triage Vital Signs: ED Triage Vitals  Enc Vitals Group     BP 02/10/22 0900 (!) 129/98     Pulse Rate 02/10/22 0900 72     Resp 02/10/22 0900 18     Temp 02/10/22 0900 98.1 F (36.7 C)     Temp Source 02/10/22 0900 Oral     SpO2 02/10/22 0900 97 %     Weight 02/10/22 0903 174 lb (78.9 kg)     Height 02/10/22 0903 5\' 6"  (1.676 m)     Head Circumference --      Peak Flow --      Pain Score 02/10/22 0902 0     Pain Loc --      Pain Edu? --      Excl. in GC? --     Most recent vital signs: Vitals:   02/10/22 0900  BP: (!) 129/98  Pulse: 72  Resp: 18  Temp: 98.1 F (36.7 C)  SpO2: 97%     General: Awake, no distress.  CV:  Good peripheral perfusion.  Heart regular rate and rhythm. Resp:  Normal effort.  Lungs are clear. Abd:  No distention.  Other:  On examination of the right lower extremity there is no marked pitting edema or restricted movement.  Good muscle strength bilaterally at 5/5.  Patient is able to move lower extremities without difficulty.  No point tenderness noted on palpation and skin is not warm to touch.  There is a erythematous macular patch posteriorly on the proximal aspect of the tib-fib without vesicles or papules.  Nontender to touch.   ED Results / Procedures / Treatments   Labs (all labs ordered are listed, but only abnormal results are displayed) Labs Reviewed  - No data to display   RADIOLOGY  Ultrasound right lower extremity for DVT was negative however the radiologist did see a Baker's cyst.   PROCEDURES:  Critical Care performed:   Procedures   MEDICATIONS ORDERED IN ED: Medications - No data to display   IMPRESSION / MDM / ASSESSMENT AND PLAN / ED COURSE  I reviewed the triage vital signs and the nursing notes.   Differential diagnosis includes, but is not limited to, DVT right lower extremity, right lower leg pain acute, contact dermatitis.  88 year old male presents to the ED with concerns for a possible right lower leg DVT as patient has had a history of the same.  Patient continues to take Eliquis but began having pain during the night.  It was also noted that he had a rash to the back of his leg which she has been scratching at and states that it is itching.  No other rash was noted on examination of the lower extremity.  Ultrasound was negative for DVT but did show a Baker's cyst which was  discussed with patient and family member.  A prescription for triamcinolone cream was sent to the pharmacy to use on the rash to see if this helps with itching along with resolving the rash.  He is to follow-up with his PCP if any continued problems and also follow-up with orthopedics should removal of the Baker's cyst become an issue.        FINAL CLINICAL IMPRESSION(S) / ED DIAGNOSES   Final diagnoses:  Baker's cyst of knee, right  Contact dermatitis, unspecified contact dermatitis type, unspecified trigger     Rx / DC Orders   ED Discharge Orders          Ordered    triamcinolone cream (KENALOG) 0.1 %  2 times daily        02/10/22 1039             Note:  This document was prepared using Dragon voice recognition software and may include unintentional dictation errors.   Tommi Rumps, PA-C 02/10/22 1504    Arnaldo Natal, MD 02/10/22 [phone]

## 2022-02-10 NOTE — Discharge Instructions (Addendum)
Call make an appointment with Dr. Allena Katz who is the orthopedist on-call for [facility].  The Baker's cyst can cause discomfort but no blood clot was noted on your ultrasound today.  Continue taking your regular medication.  A prescription for triamcinolone cream was sent to the pharmacy to begin using on your rash which will help with itching as well as clear the rash up.  If not improving or any worsening you may end up needing to see a dermatologist.

## 2022-02-10 NOTE — ED Triage Notes (Signed)
Pt via POV from home. Pt c/o R calf pain, pt has a hx of DVT in 2021, pt is taking Eliquis. Denies any injuries. Pt states that pain started last night. Some redness and swelling noticed to the R calf. Denies any pain. States it itches. Pt is A&OX4 and NAD.   Pt has a hx of dementia, pt is accompanied by daughter.

## 2022-03-27 ENCOUNTER — Encounter (INDEPENDENT_AMBULATORY_CARE_PROVIDER_SITE_OTHER): Payer: Self-pay | Admitting: Vascular Surgery

## 2022-03-27 ENCOUNTER — Ambulatory Visit (INDEPENDENT_AMBULATORY_CARE_PROVIDER_SITE_OTHER): Payer: PPO | Admitting: Vascular Surgery

## 2022-03-27 VITALS — BP 137/74 | HR 76 | Resp 16 | Wt 177.6 lb

## 2022-03-27 DIAGNOSIS — N1831 Chronic kidney disease, stage 3a: Secondary | ICD-10-CM | POA: Diagnosis not present

## 2022-03-27 NOTE — Progress Notes (Signed)
MRN : [id number]  Bryan Garza Bryan Garza is a 88 y.o. (21-Feb-1934) male who presents with chief complaint of  Chief Complaint  Patient presents with   Follow-up    1 yr follow up  .  History of Present Illness: Patient returns today in follow up of chronic left lower extremity DVT as well as some chronic swelling.  A few weeks ago, he had a spider bite on the left leg but there was concern this could have been worsening in the cellulitis and worsening swelling.  He used some topical ointments within a few days this was all better.  He has very mild left lower extremity swelling at baseline.  This is not bothersome to him.  He overall is doing well.  He remains on 2.5 mg twice daily of Eliquis and has tolerated that well.  Current Outpatient Medications  Medication Sig Dispense Refill   apixaban (ELIQUIS) 2.5 MG TABS tablet Take 1 tablet (2.5 mg total) by mouth 2 (two) times daily. 60 tablet 1   Ashwagandha 125 MG CAPS Take 1 capsule by mouth daily.     donepezil (ARICEPT) 5 MG tablet Take 5 mg by mouth at bedtime.     famotidine (PEPCID) 40 MG tablet Take 1 tablet by mouth at bedtime.     finasteride (PROSCAR) 5 MG tablet Take 5 mg by mouth daily.     guaiFENesin (MUCINEX) 600 MG 12 hr tablet Take 600 mg by mouth 2 (two) times daily as needed.     memantine (NAMENDA) 5 MG tablet Take 5 mg by mouth 2 (two) times daily.     Misc Natural Products (HERBAL ENERGY COMPLEX PO) Take by mouth.     Multiple Vitamin (MULTIVITAMIN WITH MINERALS) TABS tablet Take 1 tablet by mouth daily.     Omega-3 Fatty Acids (OMEGA-3 FISH OIL) 300 MG CAPS Take by mouth.     omeprazole (PRILOSEC) 40 MG capsule Take 40 mg by mouth daily.     QUEtiapine (SEROQUEL) 50 MG tablet Take 50 mg by mouth at bedtime.     tamsulosin (FLOMAX) 0.4 MG CAPS capsule TAKE 2 CAPSULES BY MOUTH ONCE DAILY. TAKE 30 MINUTES AFTER SAME MEAL EACH DAY.     traZODone (DESYREL) 50 MG tablet Take 50 mg by mouth at bedtime.     triamcinolone  cream (KENALOG) 0.1 % Apply 1 application. topically 2 (two) times daily. 30 g 0   HYDROcodone-acetaminophen (NORCO/VICODIN) 5-325 MG tablet Take 1 tablet by mouth every 6 (six) hours as needed for moderate pain or severe pain. (Patient not taking: Reported on 03/27/2022) 30 tablet 0   No current facility-administered medications for this visit.    Past Medical History:  Diagnosis Date   Arthritis     Past Surgical History:  Procedure Laterality Date   MINOR HEMORRHOIDECTOMY     PERIPHERAL VASCULAR THROMBECTOMY Left 03/16/2020   Procedure: PERIPHERAL VASCULAR THROMBECTOMY;  Surgeon: Annice Needy, MD;  Location: [facility];  Service: Cardiovascular;  Laterality: Left;     Social History   Tobacco Use   Smoking status: Former   Smokeless tobacco: Never  Substance Use Topics   Alcohol use: No   Drug use: Never       Family History  Problem Relation Age of Onset   Lung cancer Father      Allergies  Allergen Reactions   Tetracyclines & Related Nausea And Vomiting    Flu symptoms      REVIEW  OF SYSTEMS (Negative unless checked)   Constitutional: [] Weight loss  [] Fever  [] Chills Cardiac: [] Chest pain   [] Chest pressure   [] Palpitations   [] Shortness of breath when laying flat   [] Shortness of breath at rest   [] Shortness of breath with exertion. Vascular:  [] Pain in legs with walking   [] Pain in legs at rest   [] Pain in legs when laying flat   [] Claudication   [] Pain in feet when walking  [] Pain in feet at rest  [] Pain in feet when laying flat   [x] History of DVT   [x] Phlebitis   [] Swelling in legs   [] Varicose veins   [] Non-healing ulcers Pulmonary:   [] Uses home oxygen   [] Productive cough   [] Hemoptysis   [] Wheeze  [] COPD   [] Asthma Neurologic:  [] Dizziness  [] Blackouts   [] Seizures   [] History of stroke   [] History of TIA  [] Aphasia   [] Temporary blindness   [] Dysphagia   [] Weakness or numbness in arms   [] Weakness or numbness in legs Musculoskeletal:   [x] Arthritis   [] Joint swelling   [] Joint pain   [] Low back pain Hematologic:  [] Easy bruising  [] Easy bleeding   [] Hypercoagulable state   [] Anemic   Gastrointestinal:  [] Blood in stool   [] Vomiting blood  [] Gastroesophageal reflux/heartburn   [] Abdominal pain Genitourinary:  [] Chronic kidney disease   [] Difficult urination  [] Frequent urination  [] Burning with urination   [] Hematuria Skin:  [] Rashes   [] Ulcers   [] Wounds Psychological:  [] History of anxiety   []  History of major depression.   Physical Examination  BP 137/74 (BP Location: Right Arm)   Pulse 76   Resp 16   Wt 177 lb 9.6 oz (80.6 kg)   BMI 28.67 kg/m  Gen:  WD/WN, NAD Head: [place]/AT, No temporalis wasting. Ear/Nose/Throat: Hearing grossly intact, nares w/o erythema or drainage Eyes: Conjunctiva clear. Sclera non-icteric Neck: Supple.  Trachea midline Pulmonary:  Good air movement, no use of accessory muscles.  Cardiac: RRR, no JVD Vascular:  Vessel Right Left  Radial Palpable Palpable                   Musculoskeletal: M/S 5/5 throughout.  No deformity or atrophy. Trace LLE edema. Neurologic: Sensation grossly intact in extremities.  Symmetrical.  Speech is fluent.  Psychiatric: Judgment and insight are poor Dermatologic: No rashes or ulcers noted.  No cellulitis or open wounds.      Labs No results found for this or any previous visit (from the past 2160 hour(s)).  Radiology No results found.  Assessment/Plan DVT (deep venous thrombosis) (HCC) The patient is previously had thrombectomy for extensive left lower extremity DVT.  After thrombectomy and with appropriate conservative therapies of compression socks and elevation, he is doing well with essentially no pain or swelling in his legs.  We are going to continue the 2.5 mg twice daily of Eliquis which she seems to be tolerating well.  We can see him back in 1 year.  Stage 3a chronic kidney disease (HCC) Can worsen lower extremity swelling but at this  point that is not an issue.    , MD  03/27/2022 11:47 AM    This note was created with Dragon medical transcription system.  Any errors from dictation are purely unintentional

## 2022-03-27 NOTE — Assessment & Plan Note (Signed)
Can worsen lower extremity swelling but at this point that is not an issue.

## 2022-05-31 ENCOUNTER — Other Ambulatory Visit (INDEPENDENT_AMBULATORY_CARE_PROVIDER_SITE_OTHER): Payer: Self-pay | Admitting: Vascular Surgery

## 2022-07-07 ENCOUNTER — Other Ambulatory Visit (INDEPENDENT_AMBULATORY_CARE_PROVIDER_SITE_OTHER): Payer: Self-pay | Admitting: Nurse Practitioner

## 2022-07-23 IMAGING — US US EXTREM LOW VENOUS*R*
1 series · 13 of 24 positions shown · non-contrast
Comparison: None Available.

CLINICAL DATA: swollen, pain.  Hx of DVT



[Series 1: us venous img lower uni right (dvt) · portal-venous · 13 of 33 slices shown]
[im 1/33]
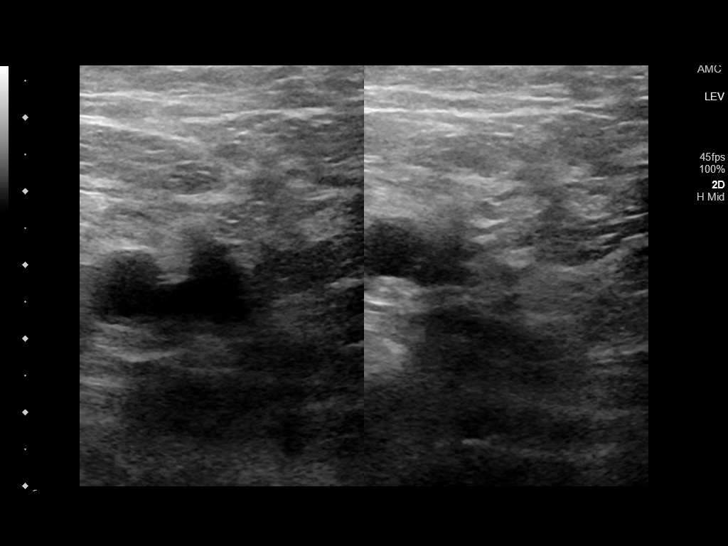
[im 3/33]
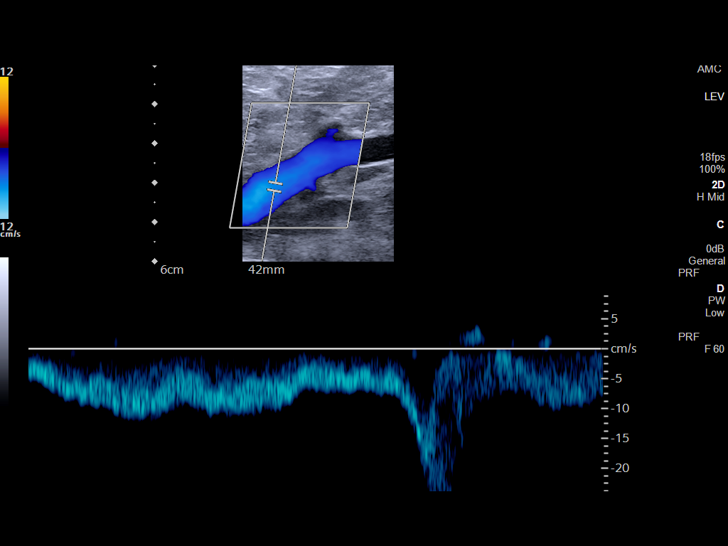
[im 6/33]
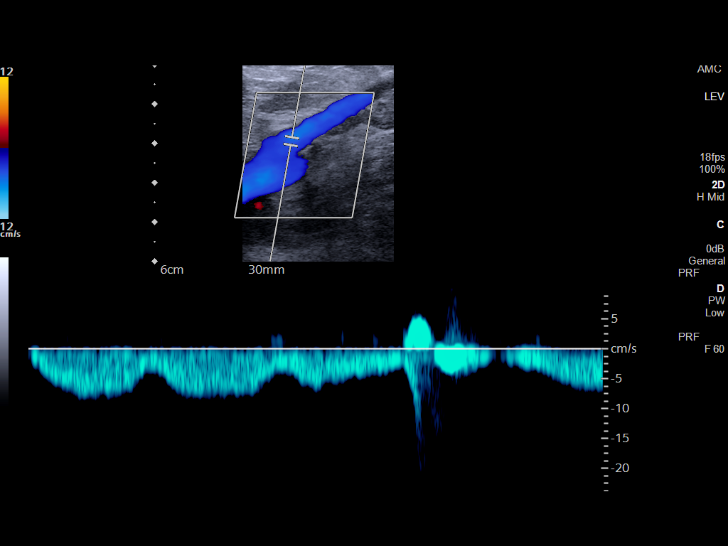
[im 9/33]
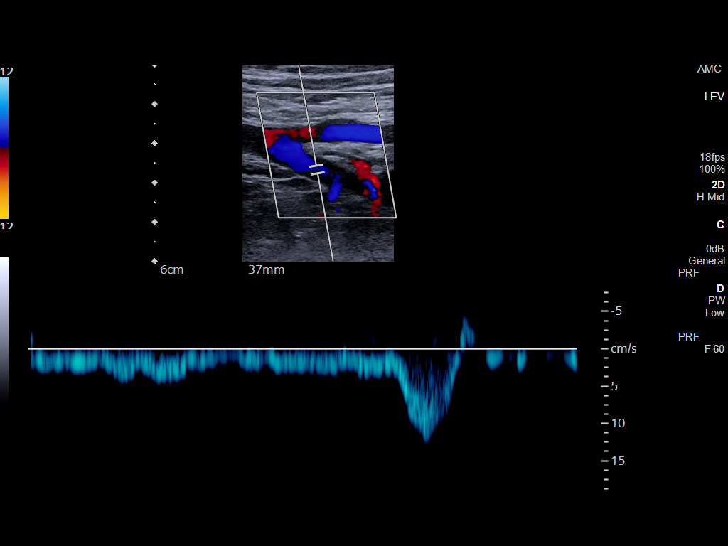
[im 12/33]
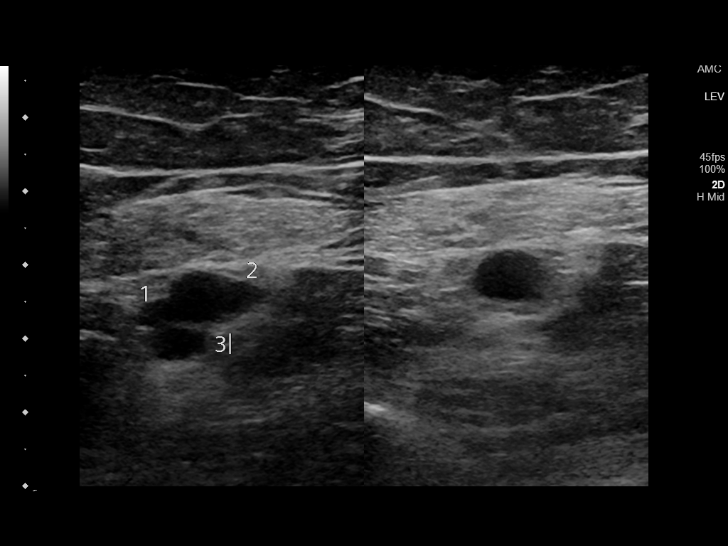
[im 14/33]
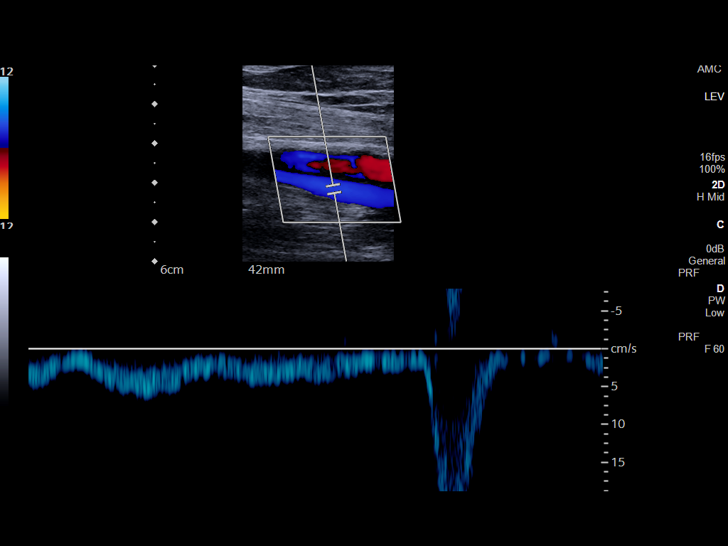
[im 17/33]
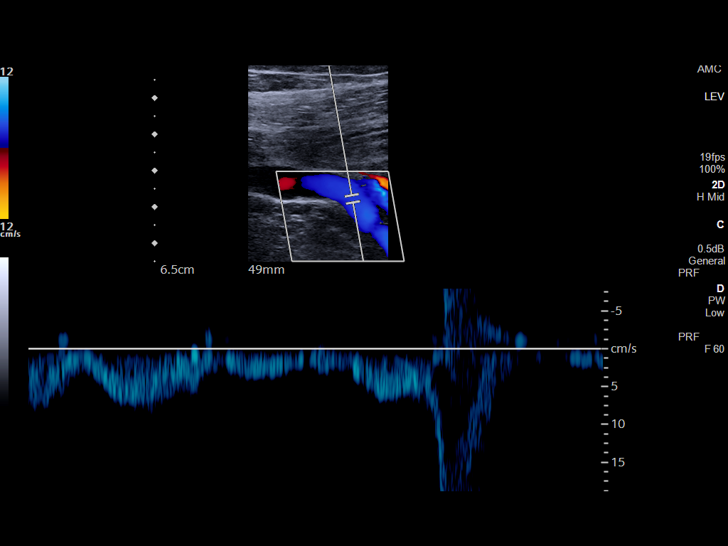
[im 19/33]
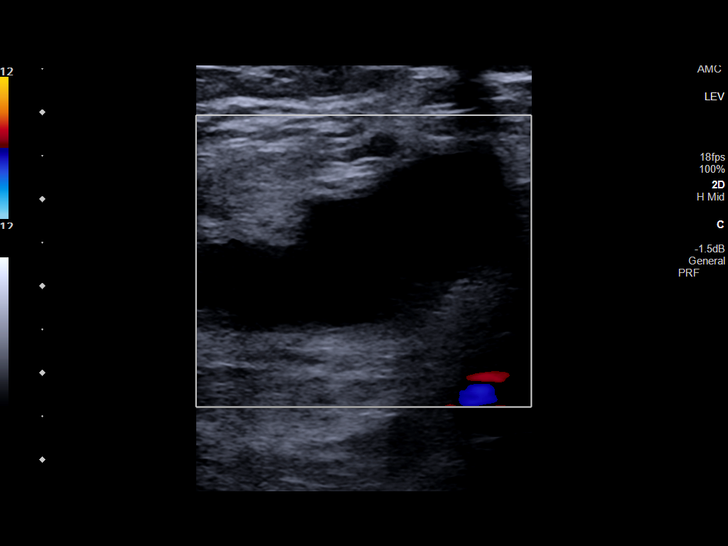
[im 21/33]
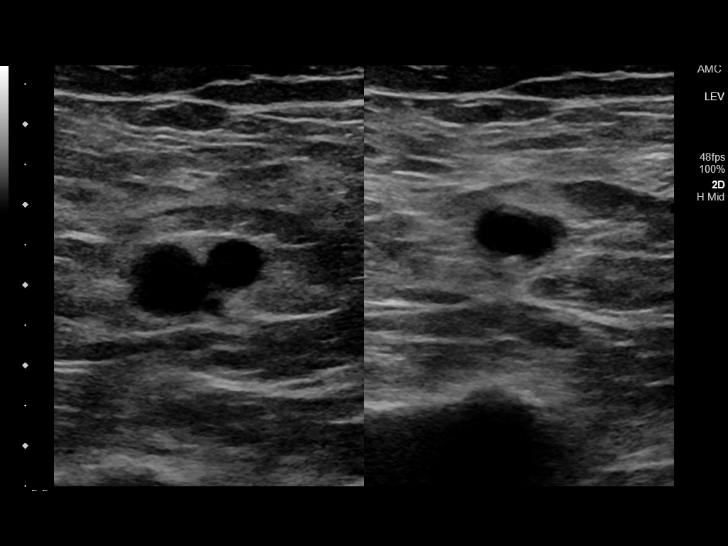
[im 24/33]
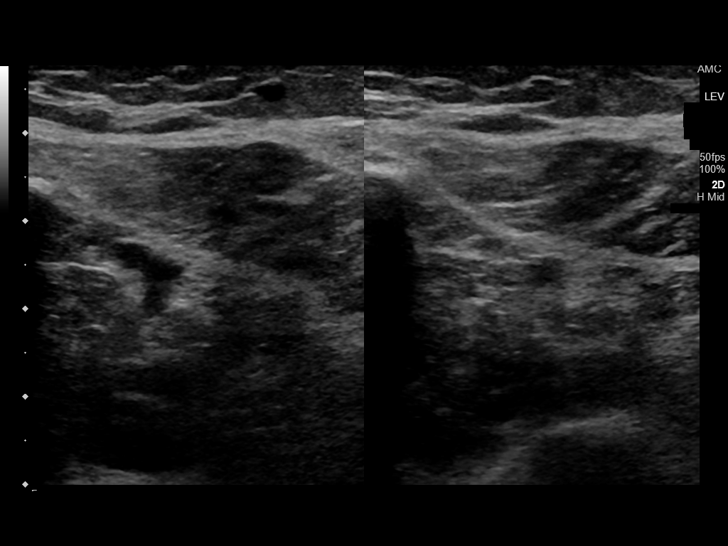
[im 27/33]
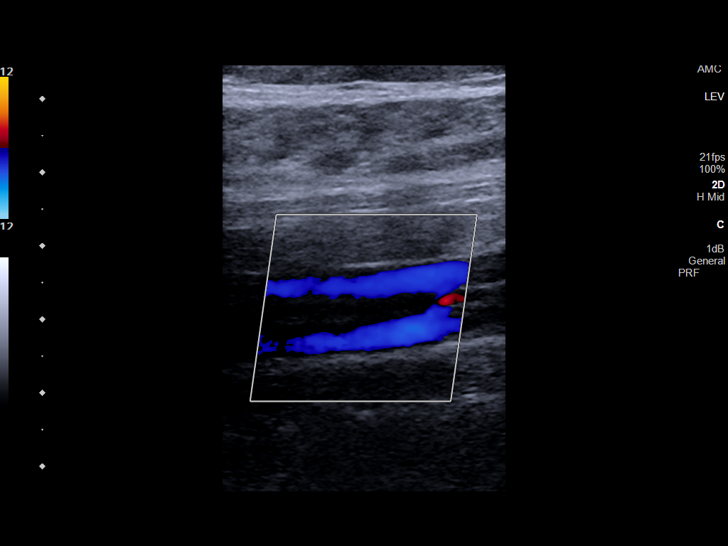
[im 30/33]
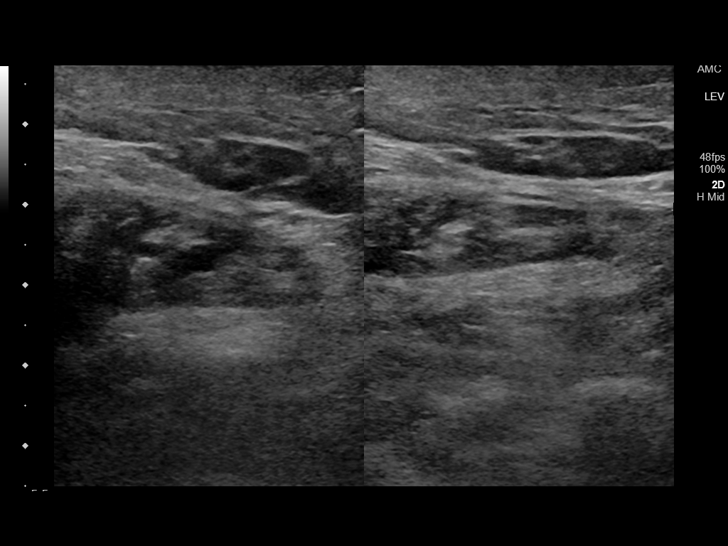
[im 33/33]
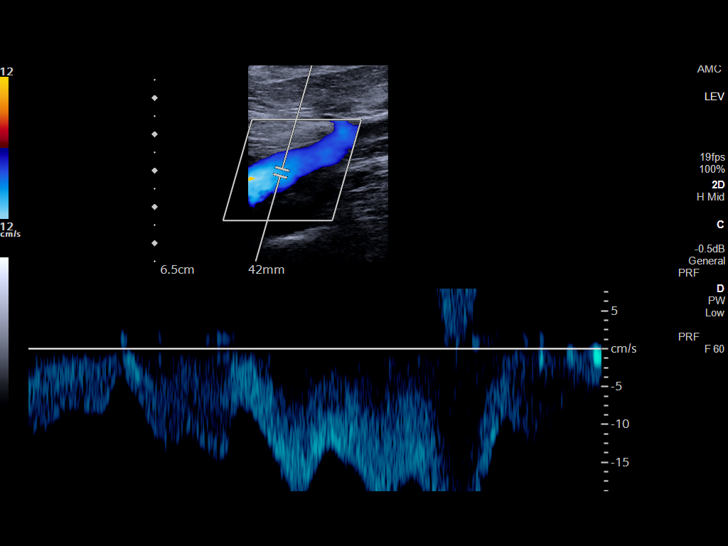

[13 of 24 positions shown; findings below may reference images not displayed]

FINDINGS: Contralateral Common Femoral Vein: Respiratory phasicity is normal
and symmetric with the symptomatic side. No evidence of thrombus.
Normal compressibility.

Common Femoral Vein: No evidence of thrombus. Normal
compressibility, respiratory phasicity and response to augmentation.

Saphenofemoral Junction: No evidence of thrombus. Normal
compressibility and flow on color Doppler imaging.

Profunda Femoral Vein: No evidence of thrombus. Normal
compressibility and flow on color Doppler imaging.

Femoral Vein: No evidence of thrombus. Normal compressibility,
respiratory phasicity and response to augmentation.

Popliteal Vein: No evidence of thrombus. Normal compressibility,
respiratory phasicity and response to augmentation.

Calf Veins: No evidence of thrombus. Normal compressibility and flow
on color Doppler imaging.

Other Findings: Anechoic fluid in the right popliteal fossa
measuring 4.9 cm compatible with Baker's cyst.
IMPRESSION: No evidence of deep venous thrombosis.

## 2022-09-21 DIAGNOSIS — N1831 Chronic kidney disease, stage 3a: Secondary | ICD-10-CM | POA: Diagnosis not present

## 2022-09-21 DIAGNOSIS — I8290 Acute embolism and thrombosis of unspecified vein: Secondary | ICD-10-CM | POA: Diagnosis not present

## 2022-09-21 DIAGNOSIS — K219 Gastro-esophageal reflux disease without esophagitis: Secondary | ICD-10-CM | POA: Diagnosis not present

## 2022-09-21 DIAGNOSIS — R351 Nocturia: Secondary | ICD-10-CM | POA: Diagnosis not present

## 2022-09-21 DIAGNOSIS — Z7901 Long term (current) use of anticoagulants: Secondary | ICD-10-CM | POA: Diagnosis not present

## 2022-09-21 DIAGNOSIS — F0390 Unspecified dementia without behavioral disturbance: Secondary | ICD-10-CM | POA: Diagnosis not present

## 2022-09-21 DIAGNOSIS — N401 Enlarged prostate with lower urinary tract symptoms: Secondary | ICD-10-CM | POA: Diagnosis not present

## 2022-10-06 ENCOUNTER — Emergency Department: Payer: PPO

## 2022-10-06 ENCOUNTER — Inpatient Hospital Stay
Admission: EM | Admit: 2022-10-06 | Discharge: 2022-10-07 | DRG: 271 | Disposition: A | Discharge status: Other Acute Inpatient Hospital | Payer: PPO | Attending: Cardiology | Admitting: Cardiology

## 2022-10-06 ENCOUNTER — Other Ambulatory Visit: Payer: Self-pay

## 2022-10-06 ENCOUNTER — Encounter
Admission: EM | Disposition: A | Discharge status: Other Acute Inpatient Hospital | Payer: Self-pay | Source: Home / Self Care | Attending: Internal Medicine

## 2022-10-06 DIAGNOSIS — I97710 Intraoperative cardiac arrest during cardiac surgery: Secondary | ICD-10-CM | POA: Diagnosis not present

## 2022-10-06 DIAGNOSIS — Z801 Family history of malignant neoplasm of trachea, bronchus and lung: Secondary | ICD-10-CM

## 2022-10-06 DIAGNOSIS — I959 Hypotension, unspecified: Secondary | ICD-10-CM | POA: Diagnosis present

## 2022-10-06 DIAGNOSIS — R570 Cardiogenic shock: Secondary | ICD-10-CM | POA: Diagnosis not present

## 2022-10-06 DIAGNOSIS — I2109 ST elevation (STEMI) myocardial infarction involving other coronary artery of anterior wall: Secondary | ICD-10-CM | POA: Diagnosis not present

## 2022-10-06 DIAGNOSIS — Z1152 Encounter for screening for COVID-19: Secondary | ICD-10-CM | POA: Diagnosis not present

## 2022-10-06 DIAGNOSIS — I825Y9 Chronic embolism and thrombosis of unspecified deep veins of unspecified proximal lower extremity: Secondary | ICD-10-CM | POA: Diagnosis not present

## 2022-10-06 DIAGNOSIS — I213 ST elevation (STEMI) myocardial infarction of unspecified site: Secondary | ICD-10-CM | POA: Diagnosis not present

## 2022-10-06 DIAGNOSIS — J9811 Atelectasis: Secondary | ICD-10-CM | POA: Diagnosis not present

## 2022-10-06 DIAGNOSIS — I5021 Acute systolic (congestive) heart failure: Secondary | ICD-10-CM | POA: Diagnosis not present

## 2022-10-06 DIAGNOSIS — G309 Alzheimer's disease, unspecified: Secondary | ICD-10-CM | POA: Diagnosis not present

## 2022-10-06 DIAGNOSIS — I2102 ST elevation (STEMI) myocardial infarction involving left anterior descending coronary artery: Secondary | ICD-10-CM | POA: Diagnosis not present

## 2022-10-06 DIAGNOSIS — R079 Chest pain, unspecified: Secondary | ICD-10-CM | POA: Diagnosis not present

## 2022-10-06 DIAGNOSIS — R519 Headache, unspecified: Secondary | ICD-10-CM | POA: Diagnosis not present

## 2022-10-06 DIAGNOSIS — F02A11 Dementia in other diseases classified elsewhere, mild, with agitation: Secondary | ICD-10-CM | POA: Diagnosis not present

## 2022-10-06 HISTORY — PX: LEFT HEART CATH AND CORONARY ANGIOGRAPHY: CATH118249

## 2022-10-06 HISTORY — PX: CORONARY/GRAFT ACUTE MI REVASCULARIZATION: CATH118305

## 2022-10-06 HISTORY — PX: IABP INSERTION: CATH118242

## 2022-10-06 LAB — RESP PANEL BY RT-PCR (RSV, FLU A&B, COVID)  RVPGX2
Influenza A by PCR: NEGATIVE
Influenza B by PCR: NEGATIVE
Resp Syncytial Virus by PCR: NEGATIVE
SARS Coronavirus 2 by RT PCR: NEGATIVE

## 2022-10-06 LAB — BASIC METABOLIC PANEL
Anion gap: 12 (ref 5–15)
BUN: 26 mg/dL — ABNORMAL HIGH (ref 8–23)
CO2: 21 mmol/L — ABNORMAL LOW (ref 22–32)
Calcium: 9.1 mg/dL (ref 8.9–10.3)
Chloride: 104 mmol/L (ref 98–111)
Creatinine, Ser: 1.78 mg/dL — ABNORMAL HIGH (ref 0.61–1.24)
GFR, Estimated: 37 mL/min — ABNORMAL LOW (ref >60)
Glucose, Bld: 130 mg/dL — ABNORMAL HIGH (ref 70–99)
Potassium: 3.5 mmol/L (ref 3.5–5.1)
Sodium: 137 mmol/L (ref 135–145)

## 2022-10-06 LAB — PROTIME-INR
INR: 1.2 (ref 0.8–1.2)
Prothrombin Time: 15.3 seconds — ABNORMAL HIGH (ref 11.4–15.2)

## 2022-10-06 LAB — CBC
HCT: 42.5 % (ref 39.0–52.0)
Hemoglobin: 14.1 g/dL (ref 13.0–17.0)
MCH: 29.7 pg (ref 26.0–34.0)
MCHC: 33.2 g/dL (ref 30.0–36.0)
MCV: 89.5 fL (ref 80.0–100.0)
Platelets: 182 10*3/uL (ref 150–400)
RBC: 4.75 MIL/uL (ref 4.22–5.81)
RDW: 13.3 % (ref 11.5–15.5)
WBC: 8.6 10*3/uL (ref 4.0–10.5)
nRBC: 0 % (ref 0.0–0.2)

## 2022-10-06 LAB — APTT: aPTT: >200 seconds (ref 24–36)

## 2022-10-06 LAB — TROPONIN I (HIGH SENSITIVITY): Troponin I (High Sensitivity): 43 ng/L — ABNORMAL HIGH (ref <18)

## 2022-10-06 SURGERY — CORONARY/GRAFT ACUTE MI REVASCULARIZATION
Anesthesia: Moderate Sedation | Laterality: Right

## 2022-10-06 MED ORDER — LIDOCAINE HCL (PF) 1 % IJ SOLN
INTRAMUSCULAR | Status: DC | PRN
  Administered 2022-10-06: 2 mL

## 2022-10-06 MED ORDER — SODIUM CHLORIDE 0.9 % IV BOLUS
1000.0000 mL | Freq: Once | INTRAVENOUS | Status: AC
  Administered 2022-10-06: 1000 mL via INTRAVENOUS

## 2022-10-06 MED ORDER — HEPARIN SODIUM (PORCINE) 1000 UNIT/ML IJ SOLN
INTRAMUSCULAR | Status: AC
  Filled 2022-10-06: qty 10

## 2022-10-06 MED ORDER — MORPHINE SULFATE (PF) 2 MG/ML IV SOLN
2.0000 mg | Freq: Once | INTRAVENOUS | Status: AC
  Administered 2022-10-06: 2 mg via INTRAVENOUS
  Filled 2022-10-06: qty 1

## 2022-10-06 MED ORDER — HEPARIN (PORCINE) IN NACL 1000-0.9 UT/500ML-% IV SOLN
INTRAVENOUS | Status: AC
  Filled 2022-10-06: qty 1000

## 2022-10-06 MED ORDER — FENTANYL CITRATE PF 50 MCG/ML IJ SOSY
PREFILLED_SYRINGE | INTRAMUSCULAR | Status: AC
  Filled 2022-10-06: qty 1

## 2022-10-06 MED ORDER — CANGRELOR TETRASODIUM 50 MG IV SOLR
INTRAVENOUS | Status: AC
  Filled 2022-10-06: qty 50

## 2022-10-06 MED ORDER — VERAPAMIL HCL 2.5 MG/ML IV SOLN
INTRAVENOUS | Status: DC | PRN
  Administered 2022-10-06: 2.5 mg via INTRAVENOUS

## 2022-10-06 MED ORDER — HEPARIN SODIUM (PORCINE) 1000 UNIT/ML IJ SOLN
INTRAMUSCULAR | Status: DC | PRN
  Administered 2022-10-06: 10000 [IU] via INTRAVENOUS

## 2022-10-06 MED ORDER — HEPARIN (PORCINE) 25000 UT/250ML-% IV SOLN
1100.0000 [IU]/h | INTRAVENOUS | Status: DC
  Administered 2022-10-06: 1100 [IU]/h via INTRAVENOUS
  Filled 2022-10-06: qty 250

## 2022-10-06 MED ORDER — MIDAZOLAM HCL 2 MG/2ML IJ SOLN
INTRAMUSCULAR | Status: AC
  Filled 2022-10-06: qty 2

## 2022-10-06 MED ORDER — VERAPAMIL HCL 2.5 MG/ML IV SOLN
INTRAVENOUS | Status: AC
  Filled 2022-10-06: qty 2

## 2022-10-06 MED ORDER — POLYETHYLENE GLYCOL 3350 17 G PO PACK: 17.0000 g | PACK | Freq: Every day | ORAL | Status: DC | PRN

## 2022-10-06 MED ORDER — NOREPINEPHRINE 4 MG/250ML-% IV SOLN: 0.0000 ug/min | INTRAVENOUS | Status: DC

## 2022-10-06 MED ORDER — SODIUM CHLORIDE 0.9 % IV SOLN
INTRAVENOUS | Status: DC | PRN
  Administered 2022-10-06: 4 ug/kg/min via INTRAVENOUS

## 2022-10-06 MED ORDER — CANGRELOR BOLUS VIA INFUSION
INTRAVENOUS | Status: DC | PRN
  Administered 2022-10-06: 2520 ug via INTRAVENOUS

## 2022-10-06 MED ORDER — ASPIRIN 81 MG PO CHEW
324.0000 mg | CHEWABLE_TABLET | Freq: Once | ORAL | Status: AC
  Administered 2022-10-06: 324 mg via ORAL

## 2022-10-06 MED ORDER — HEPARIN (PORCINE) IN NACL 1000-0.9 UT/500ML-% IV SOLN
INTRAVENOUS | Status: DC | PRN
  Administered 2022-10-06 (×2): 500 mL

## 2022-10-06 MED ORDER — NOREPINEPHRINE BITARTRATE 1 MG/ML IV SOLN
INTRAVENOUS | Status: DC | PRN
  Administered 2022-10-06: 7 ug/kg/min via INTRAVENOUS

## 2022-10-06 MED ORDER — NOREPINEPHRINE 4 MG/250ML-% IV SOLN
INTRAVENOUS | Status: AC
  Administered 2022-10-06: 5 ug/min via INTRAVENOUS
  Filled 2022-10-06: qty 250

## 2022-10-06 MED ORDER — DOCUSATE SODIUM 100 MG PO CAPS: 100.0000 mg | ORAL_CAPSULE | Freq: Two times a day (BID) | ORAL | Status: DC | PRN

## 2022-10-06 MED ORDER — HEPARIN SODIUM (PORCINE) 5000 UNIT/ML IJ SOLN
4000.0000 [IU] | Freq: Once | INTRAMUSCULAR | Status: AC
  Administered 2022-10-06: 4000 [IU] via INTRAVENOUS

## 2022-10-06 MED ORDER — FENTANYL CITRATE (PF) 100 MCG/2ML IJ SOLN
INTRAMUSCULAR | Status: AC
  Filled 2022-10-06: qty 2

## 2022-10-06 SURGICAL SUPPLY — 29 items
BALLN IABP SENSA PLUS 8F 50CC (BALLOONS) ×2
BALLN MINITREK RX 2.0X15 (BALLOONS) ×2
BALLN ~~LOC~~ EUPHORA RX 2.75X20 (BALLOONS) ×2
BALLN ~~LOC~~ EUPHORA RX 3.0X15 (BALLOONS) ×2
BALLN ~~LOC~~ EUPHORA RX 3.25X20 (BALLOONS) ×2
BALLOON IABP SENS PLUS 8F 50CC (BALLOONS) IMPLANT
BALLOON MINITREK RX 2.0X15 (BALLOONS) IMPLANT
BALLOON ~~LOC~~ EUPHORA RX 2.75X20 (BALLOONS) IMPLANT
BALLOON ~~LOC~~ EUPHORA RX 3.0X15 (BALLOONS) IMPLANT
BALLOON ~~LOC~~ EUPHORA RX 3.25X20 (BALLOONS) IMPLANT
CATH 5FR JL3.5 JR4 ANG PIG MP (CATHETERS) IMPLANT
CATH VISTA GUIDE 6FR XB3.5 (CATHETERS) IMPLANT
DEVICE RAD COMP TR BAND LRG (VASCULAR PRODUCTS) IMPLANT
DRAPE BRACHIAL (DRAPES) IMPLANT
GLIDESHEATH SLEND SS 6F .021 (SHEATH) IMPLANT
GUIDEWIRE INQWIRE 1.5J.035X260 (WIRE) IMPLANT
INQWIRE 1.5J .035X260CM (WIRE) ×2
KIT ENCORE 26 ADVANTAGE (KITS) IMPLANT
KIT SYRINGE INJ CVI SPIKEX1 (MISCELLANEOUS) IMPLANT
NDL PERC 18GX7CM (NEEDLE) IMPLANT
NEEDLE PERC 18GX7CM (NEEDLE) ×2 IMPLANT
PACK CARDIAC CATH (CUSTOM PROCEDURE TRAY) ×2 IMPLANT
PROTECTION STATION PRESSURIZED (MISCELLANEOUS) ×2
SET ATX SIMPLICITY (MISCELLANEOUS) IMPLANT
STATION PROTECTION PRESSURIZED (MISCELLANEOUS) IMPLANT
STENT ONYX FRONTIER 2.5X26 (Permanent Stent) IMPLANT
TUBING CIL FLEX 10 FLL-RA (TUBING) IMPLANT
WIRE ASAHI PROWATER 180CM (WIRE) IMPLANT
WIRE GUIDERIGHT .035X150 (WIRE) IMPLANT

## 2022-10-06 NOTE — ED Triage Notes (Signed)
Pt now noted to be belching in triage while attempting to get EKG. Pt denies regular habit of burping or hx of GERD

## 2022-10-06 NOTE — ED Notes (Signed)
ED provider at bedside with son-in-law

## 2022-10-06 NOTE — Progress Notes (Signed)
ANTICOAGULATION CONSULT NOTE - Initial Consult  Pharmacy Consult for Heparin Indication: chest pain/ACS  Allergies  Allergen Reactions   Tetracyclines & Related Nausea And Vomiting    Flu symptoms    Patient Measurements: Height: 5\' 7"  (170.2 cm) Weight: 84 kg (185 lb 3 oz) IBW/kg (Calculated) : 66.1 Heparin Dosing Weight: 86.4 kg   Vital Signs: Temp: 97.3 F (36.3 C) (01/20 2227) Temp Source: Oral (01/20 2227) BP: 96/61 (01/20 2230) Pulse Rate: 68 (01/20 2230)  Labs: Recent Labs    10/06/22 2155  HGB 14.1  HCT 42.5  PLT 182  CREATININE 1.78*  TROPONINIHS 43*    Estimated Creatinine Clearance: 30.9 mL/min (A) (by C-G formula based on SCr of 1.78 mg/dL (H)).   Medical History: Past Medical History:  Diagnosis Date   Arthritis     Medications:  (Not in a hospital admission)   Assessment: Pharmacy consulted to dose heparin in this 89 year old male admitted with STEMI.  Pt was on Eliquis 2.5 mg PO BID PTA, uncertain of last dose.   Heparin 4000 units IV X 1 given by ED RN on 1/20 @ 2226. CrCl = 30.9 ml/min  Goal of Therapy:  Heparin level 0.3-0.7 units/ml aPTT 66 - 102  seconds Monitor platelets by anticoagulation protocol: Yes   Plan:  Heparin 4000 units IV X 1 given in ED on 1/20 @ 2226. Heparin drip ordered to start at 1100 units/hr.  Will use aPTT to guide dosing until correlating with HL. Will draw aPTT and HL 8 hrs after start of drip. CBC daily.   Jameriah Trotti D 10/06/2022,10:41 PM

## 2022-10-06 NOTE — ED Notes (Signed)
4000 IV heparin

## 2022-10-06 NOTE — ED Notes (Signed)
Hold fentanyl at this time per MD Archie Balboa d/t BP

## 2022-10-06 NOTE — ED Notes (Signed)
MD goodman called about BP, stated to put the NS on pressure bags which was done.

## 2022-10-06 NOTE — ED Notes (Signed)
MD Jacelyn Grip states to complete repeat EKG 2220. Pt currently in X-ray

## 2022-10-06 NOTE — ED Notes (Addendum)
Pt placed in ER bed from chair. Pt undressed and placed in gown. MD goodman at bedside. Pt placed on ZOLL pads and monitor.

## 2022-10-06 NOTE — ED Notes (Signed)
Pt transported to cath lab.  

## 2022-10-06 NOTE — ED Triage Notes (Signed)
Diffuse chest pain with acute onset just prior to arrival. Pt denies h/a, dizziness, vision change, parasthesia. Denies n/v/d or SOB. Denies cardiac hx. Pt breathing unlabored speaking in full sentences.

## 2022-10-06 NOTE — ED Notes (Signed)
Activated Code Stemi w/[facility]

## 2022-10-06 NOTE — Progress Notes (Signed)
Chaplain responded to Code Blue. Provided orientation, support to family and facilitated communication with medical staff.  Please contact for ongoing needs.    Minus [place], [place]  Pager:  [phone]    10/06/22 2300  Spiritual Encounters  Type of Visit Initial  Care provided to: [facility] partners present during encounter Social worker/Care management/TOC  Referral source Code page  Reason for visit Code  Spiritual Framework  Patient Stress Factors Health changes  Family Stress Factors Lack of knowledge

## 2022-10-06 NOTE — ED Provider Notes (Signed)
[facility] Provider Note    Event Date/Time   First MD Initiated Contact with Patient 10/06/22 2221     (approximate)   History   Chest Pain   HPI  CHANNIN AGUSTIN Tobin Chad is a 89 y.o. male  who presents to the emergency department today because of concern for chest pain. Pain started roughly 25 minutes prior to the patient's arrival.  History primarily obtained from son in law at bedside. States that he heard the patient making noise in his room and when he went there the patient was complaining of chest pain. The patient has no known history of heart disease. The patient denies history of HTN, HLD or DM. He does state that there is some radiation into his left arm.     Physical Exam   Triage Vital Signs: ED Triage Vitals  Enc Vitals Group     BP 10/06/22 2142 103/69     Pulse Rate 10/06/22 2142 67     Resp 10/06/22 2142 20     Temp 10/06/22 2227 (!) 97.3 F (36.3 C)     Temp Source 10/06/22 2142 Axillary     SpO2 10/06/22 2142 99 %     Weight 10/06/22 2143 210 lb (95.3 kg)     Height 10/06/22 2143 5\' 7"  (1.702 m)     Head Circumference --      Peak Flow --      Pain Score 10/06/22 2143 9     Pain Loc --      Pain Edu? --      Excl. in [place]? --     Most recent vital signs: Vitals:   10/06/22 2240 10/06/22 2245  BP: (!) 72/58 (!) 77/66  Pulse: 67 64  Resp: 16 20  Temp:    SpO2: 98% 100%   General: Awake, alert, oriented.  Uncomfortable appearing. CV:  Good peripheral perfusion. Regular rate and rhythm. Resp:  Normal effort. Lungs clear. Abd:  No distention.    ED Results / Procedures / Treatments   Labs (all labs ordered are listed, but only abnormal results are displayed) Labs Reviewed  BASIC METABOLIC PANEL - Abnormal; Notable for the following components:      Result Value   CO2 21 (*)    Glucose, Bld 130 (*)    BUN 26 (*)    Creatinine, Ser 1.78 (*)    GFR, Estimated 37 (*)    All other components within normal limits   TROPONIN I (HIGH SENSITIVITY) - Abnormal; Notable for the following components:   Troponin I (High Sensitivity) 43 (*)    All other components within normal limits  RESP PANEL BY RT-PCR (RSV, FLU A&B, COVID)  RVPGX2  CBC  APTT  PROTIME-INR     EKG  I, Nance Pear, attending physician, personally viewed and interpreted this EKG  EKG Time: 2214 Rate: 68 Rhythm: sinus rhythm with 1st degree av block Axis: left axis deviation Intervals: qtc 457 QRS: q waves v1, v2 ST changes: st elevation v2, v3 Impression: STEMI    RADIOLOGY I independently interpreted and visualized the CXR. My interpretation: No pneumonia Radiology interpretation:  IMPRESSION:  Chronic appearing increased lung markings without acute  cardiopulmonary disease.      PROCEDURES:  Critical Care performed: Yes, see critical care procedure note(s)  Procedures  CRITICAL CARE Performed by: Nance Pear   Total critical care time: 35 minutes  Critical care time was exclusive of separately billable procedures and treating  other patients.  Critical care was necessary to treat or prevent imminent or life-threatening deterioration.  Critical care was time spent personally by me on the following activities: development of treatment plan with patient and/or surrogate as well as nursing, discussions with consultants, evaluation of patient's response to treatment, examination of patient, obtaining history from patient or surrogate, ordering and performing treatments and interventions, ordering and review of laboratory studies, ordering and review of radiographic studies, pulse oximetry and re-evaluation of patient's condition.   MEDICATIONS ORDERED IN ED: Medications  fentaNYL (SUBLIMAZE) 50 MCG/ML injection (has no administration in time range)  heparin ADULT infusion 100 units/mL (25000 units/274mL) (1,100 Units/hr Intravenous New Bag/Given 10/06/22 2249)  morphine (PF) 2 MG/ML injection 2 mg (2 mg  Intravenous Given 10/06/22 2207)  aspirin chewable tablet 324 mg (324 mg Oral Given 10/06/22 2225)  heparin injection 4,000 Units (4,000 Units Intravenous Given 10/06/22 2226)  sodium chloride 0.9 % bolus 1,000 mL (1,000 mLs Intravenous New Bag/Given 10/06/22 2227)  sodium chloride 0.9 % bolus 1,000 mL (1,000 mLs Intravenous New Bag/Given 10/06/22 2232)     IMPRESSION / MDM / ASSESSMENT AND PLAN / ED COURSE  I reviewed the triage vital signs and the nursing notes.                              Differential diagnosis includes, but is not limited to, ACS, PE, dissection, GERD  Patient's presentation is most consistent with acute presentation with potential threat to life or bodily function.   The patient is on the cardiac monitor to evaluate for evidence of arrhythmia and/or significant heart rate changes.   Patient presents to the emergency department today because of concerns for chest pain that started roughly 25 minutes prior to arrival.  Initial EKG was read by a colleague who requested a repeat EKG.  Upon being presented with the repeat EKG I asked the secretary to page out Code STEMI. Patient was given ASA and heparin.  Patient was connected to the [place]. Patient's initial blood pressure was on the low side and then started dropping.  He was given multiple IV fluid boluses.  Additionally asked nursing staff to start Levophed to help support his pressure.  Dr. Saralyn Pilar with cardiology did take the patient emergently to the catheterization lab.  FINAL CLINICAL IMPRESSION(S) / ED DIAGNOSES   Final diagnoses:  ST elevation myocardial infarction (STEMI), unspecified artery ([place])      Note:  This document was prepared using Dragon voice recognition software and may include unintentional dictation errors.    Nance Pear, MD 10/07/22 [phone]

## 2022-10-06 NOTE — ED Notes (Signed)
ED Provider at bedside. 

## 2022-10-06 NOTE — ED Notes (Addendum)
Cardiology at bedside.

## 2022-10-07 ENCOUNTER — Inpatient Hospital Stay (HOSPITAL_COMMUNITY)
Admission: RE | Admit: 2022-10-07 | Discharge: 2022-10-11 | DRG: 280 | Disposition: A | Discharge status: Home / Self Care | Payer: PPO | Attending: Cardiology | Admitting: Cardiology

## 2022-10-07 ENCOUNTER — Encounter (HOSPITAL_COMMUNITY): Payer: Self-pay

## 2022-10-07 ENCOUNTER — Inpatient Hospital Stay (HOSPITAL_COMMUNITY): Payer: PPO

## 2022-10-07 DIAGNOSIS — F03A0 Unspecified dementia, mild, without behavioral disturbance, psychotic disturbance, mood disturbance, and anxiety: Secondary | ICD-10-CM | POA: Diagnosis not present

## 2022-10-07 DIAGNOSIS — I2109 ST elevation (STEMI) myocardial infarction involving other coronary artery of anterior wall: Secondary | ICD-10-CM | POA: Diagnosis not present

## 2022-10-07 DIAGNOSIS — Z87891 Personal history of nicotine dependence: Secondary | ICD-10-CM

## 2022-10-07 DIAGNOSIS — I2510 Atherosclerotic heart disease of native coronary artery without angina pectoris: Secondary | ICD-10-CM | POA: Diagnosis not present

## 2022-10-07 DIAGNOSIS — Z888 Allergy status to other drugs, medicaments and biological substances status: Secondary | ICD-10-CM

## 2022-10-07 DIAGNOSIS — I5021 Acute systolic (congestive) heart failure: Secondary | ICD-10-CM | POA: Diagnosis not present

## 2022-10-07 DIAGNOSIS — I440 Atrioventricular block, first degree: Secondary | ICD-10-CM | POA: Diagnosis present

## 2022-10-07 DIAGNOSIS — Z79899 Other long term (current) drug therapy: Secondary | ICD-10-CM | POA: Diagnosis not present

## 2022-10-07 DIAGNOSIS — N179 Acute kidney failure, unspecified: Secondary | ICD-10-CM | POA: Diagnosis not present

## 2022-10-07 DIAGNOSIS — E785 Hyperlipidemia, unspecified: Secondary | ICD-10-CM | POA: Insufficient documentation

## 2022-10-07 DIAGNOSIS — I2102 ST elevation (STEMI) myocardial infarction involving left anterior descending coronary artery: Secondary | ICD-10-CM

## 2022-10-07 DIAGNOSIS — Z86718 Personal history of other venous thrombosis and embolism: Secondary | ICD-10-CM

## 2022-10-07 DIAGNOSIS — I82409 Acute embolism and thrombosis of unspecified deep veins of unspecified lower extremity: Secondary | ICD-10-CM | POA: Diagnosis present

## 2022-10-07 DIAGNOSIS — Z7901 Long term (current) use of anticoagulants: Secondary | ICD-10-CM

## 2022-10-07 DIAGNOSIS — I825Y9 Chronic embolism and thrombosis of unspecified deep veins of unspecified proximal lower extremity: Secondary | ICD-10-CM | POA: Diagnosis not present

## 2022-10-07 DIAGNOSIS — I213 ST elevation (STEMI) myocardial infarction of unspecified site: Principal | ICD-10-CM | POA: Diagnosis present

## 2022-10-07 DIAGNOSIS — N1831 Chronic kidney disease, stage 3a: Secondary | ICD-10-CM | POA: Diagnosis present

## 2022-10-07 DIAGNOSIS — R570 Cardiogenic shock: Secondary | ICD-10-CM

## 2022-10-07 DIAGNOSIS — R918 Other nonspecific abnormal finding of lung field: Secondary | ICD-10-CM | POA: Diagnosis not present

## 2022-10-07 DIAGNOSIS — Z955 Presence of coronary angioplasty implant and graft: Secondary | ICD-10-CM | POA: Diagnosis not present

## 2022-10-07 DIAGNOSIS — R519 Headache, unspecified: Secondary | ICD-10-CM | POA: Diagnosis not present

## 2022-10-07 DIAGNOSIS — I255 Ischemic cardiomyopathy: Secondary | ICD-10-CM | POA: Diagnosis not present

## 2022-10-07 DIAGNOSIS — Z95811 Presence of heart assist device: Secondary | ICD-10-CM | POA: Diagnosis not present

## 2022-10-07 DIAGNOSIS — Z95828 Presence of other vascular implants and grafts: Secondary | ICD-10-CM | POA: Diagnosis not present

## 2022-10-07 DIAGNOSIS — F02A11 Dementia in other diseases classified elsewhere, mild, with agitation: Secondary | ICD-10-CM | POA: Diagnosis not present

## 2022-10-07 DIAGNOSIS — G309 Alzheimer's disease, unspecified: Secondary | ICD-10-CM | POA: Diagnosis not present

## 2022-10-07 LAB — ECHOCARDIOGRAM COMPLETE
AR max vel: 1.97 cm2
AV Area VTI: 1.94 cm2
AV Area mean vel: 1.88 cm2
AV Mean grad: 3 mmHg
AV Peak grad: 5.2 mmHg
Ao pk vel: 1.14 m/s
Area-P 1/2: 3.61 cm2
Height: 67 in
S' Lateral: 4.1 cm
Weight: 2962.98 oz

## 2022-10-07 LAB — BASIC METABOLIC PANEL
Anion gap: 10 (ref 5–15)
BUN: 22 mg/dL (ref 8–23)
CO2: 21 mmol/L — ABNORMAL LOW (ref 22–32)
Calcium: 8.2 mg/dL — ABNORMAL LOW (ref 8.9–10.3)
Chloride: 106 mmol/L (ref 98–111)
Creatinine, Ser: 1.65 mg/dL — ABNORMAL HIGH (ref 0.61–1.24)
GFR, Estimated: 40 mL/min — ABNORMAL LOW (ref >60)
Glucose, Bld: 171 mg/dL — ABNORMAL HIGH (ref 70–99)
Potassium: 3.9 mmol/L (ref 3.5–5.1)
Sodium: 137 mmol/L (ref 135–145)

## 2022-10-07 LAB — LIPID PANEL
Cholesterol: 218 mg/dL — ABNORMAL HIGH (ref 0–200)
HDL: 42 mg/dL (ref >40)
LDL Cholesterol: 167 mg/dL — ABNORMAL HIGH (ref 0–99)
Total CHOL/HDL Ratio: 5.2 RATIO
Triglycerides: 43 mg/dL (ref <150)
VLDL: 9 mg/dL (ref 0–40)

## 2022-10-07 LAB — APTT
aPTT: >200 seconds (ref 24–36)
aPTT: 87 seconds — ABNORMAL HIGH (ref 24–36)

## 2022-10-07 LAB — CBC
HCT: 38.2 % — ABNORMAL LOW (ref 39.0–52.0)
Hemoglobin: 13.2 g/dL (ref 13.0–17.0)
MCH: 30.8 pg (ref 26.0–34.0)
MCHC: 34.6 g/dL (ref 30.0–36.0)
MCV: 89.3 fL (ref 80.0–100.0)
Platelets: 159 10*3/uL (ref 150–400)
RBC: 4.28 MIL/uL (ref 4.22–5.81)
RDW: 13.5 % (ref 11.5–15.5)
WBC: 11.4 10*3/uL — ABNORMAL HIGH (ref 4.0–10.5)
nRBC: 0 % (ref 0.0–0.2)

## 2022-10-07 LAB — MRSA NEXT GEN BY PCR, NASAL: MRSA by PCR Next Gen: NOT DETECTED

## 2022-10-07 LAB — HEPARIN LEVEL (UNFRACTIONATED): Heparin Unfractionated: >1.1 IU/mL — ABNORMAL HIGH (ref 0.30–0.70)

## 2022-10-07 LAB — MAGNESIUM: Magnesium: 1.9 mg/dL (ref 1.7–2.4)

## 2022-10-07 LAB — HEMOGLOBIN A1C
Hgb A1c MFr Bld: 5.4 % (ref 4.8–5.6)
Mean Plasma Glucose: 108.28 mg/dL

## 2022-10-07 LAB — GLUCOSE, CAPILLARY: Glucose-Capillary: 179 mg/dL — ABNORMAL HIGH (ref 70–99)

## 2022-10-07 LAB — POCT ACTIVATED CLOTTING TIME: Activated Clotting Time: 347 seconds

## 2022-10-07 MED ORDER — HEPARIN (PORCINE) 25000 UT/250ML-% IV SOLN
INTRAVENOUS | Status: AC
  Filled 2022-10-07: qty 250

## 2022-10-07 MED ORDER — ONDANSETRON HCL 4 MG/2ML IJ SOLN
INTRAMUSCULAR | Status: DC | PRN
  Administered 2022-10-07: 4 mg via INTRAVENOUS

## 2022-10-07 MED ORDER — MIDAZOLAM HCL 2 MG/2ML IJ SOLN
INTRAMUSCULAR | Status: AC
  Filled 2022-10-07: qty 2

## 2022-10-07 MED ORDER — ACETAMINOPHEN 325 MG PO TABS
650.0000 mg | ORAL_TABLET | ORAL | Status: DC | PRN
  Administered 2022-10-07: 650 mg via ORAL
  Filled 2022-10-07: qty 2

## 2022-10-07 MED ORDER — ORAL CARE MOUTH RINSE: 15.0000 mL | OROMUCOSAL | Status: DC | PRN

## 2022-10-07 MED ORDER — CEFAZOLIN SODIUM-DEXTROSE 2-4 GM/100ML-% IV SOLN
INTRAVENOUS | Status: AC
  Filled 2022-10-07: qty 100

## 2022-10-07 MED ORDER — ONDANSETRON HCL 4 MG/2ML IJ SOLN
INTRAMUSCULAR | Status: AC
  Filled 2022-10-07: qty 2

## 2022-10-07 MED ORDER — HEPARIN (PORCINE) 25000 UT/250ML-% IV SOLN
850.0000 [IU]/h | INTRAVENOUS | Status: DC
  Administered 2022-10-07: 850 [IU]/h via INTRAVENOUS

## 2022-10-07 MED ORDER — TICAGRELOR 90 MG PO TABS
180.0000 mg | ORAL_TABLET | Freq: Once | ORAL | Status: AC
  Administered 2022-10-07: 180 mg via ORAL

## 2022-10-07 MED ORDER — CEFAZOLIN (ANCEF) 1 G IV SOLR: INTRAVENOUS | Status: DC | PRN

## 2022-10-07 MED ORDER — POTASSIUM CHLORIDE CRYS ER 20 MEQ PO TBCR
20.0000 meq | EXTENDED_RELEASE_TABLET | Freq: Once | ORAL | Status: AC
  Administered 2022-10-07: 20 meq via ORAL
  Filled 2022-10-07: qty 1

## 2022-10-07 MED ORDER — CHLORHEXIDINE GLUCONATE CLOTH 2 % EX PADS
6.0000 | MEDICATED_PAD | Freq: Every day | CUTANEOUS | Status: DC
  Administered 2022-10-07 – 2022-10-08 (×2): 6 via TOPICAL

## 2022-10-07 MED ORDER — TICAGRELOR 90 MG PO TABS
90.0000 mg | ORAL_TABLET | Freq: Two times a day (BID) | ORAL | Status: DC
  Administered 2022-10-07 – 2022-10-08 (×3): 90 mg via ORAL
  Filled 2022-10-07 (×3): qty 1

## 2022-10-07 MED ORDER — ONDANSETRON HCL 4 MG/2ML IJ SOLN: 4.0000 mg | Freq: Four times a day (QID) | INTRAMUSCULAR | Status: DC | PRN

## 2022-10-07 MED ORDER — EPINEPHRINE 1 MG/10ML IJ SOSY
PREFILLED_SYRINGE | INTRAMUSCULAR | Status: AC
  Filled 2022-10-07: qty 10

## 2022-10-07 MED ORDER — HEPARIN (PORCINE) IN NACL 1000-0.9 UT/500ML-% IV SOLN
INTRAVENOUS | Status: AC
  Filled 2022-10-07: qty 500

## 2022-10-07 MED ORDER — "THROMBI-PAD 3""X3"" EX PADS"
1.0000 | MEDICATED_PAD | Freq: Once | CUTANEOUS | Status: AC
  Administered 2022-10-07: 1 via TOPICAL

## 2022-10-07 MED ORDER — MAGNESIUM SULFATE 2 GM/50ML IV SOLN
2.0000 g | Freq: Once | INTRAVENOUS | Status: AC
  Administered 2022-10-07: 2 g via INTRAVENOUS
  Filled 2022-10-07: qty 50

## 2022-10-07 MED ORDER — MIDAZOLAM HCL 2 MG/2ML IJ SOLN
INTRAMUSCULAR | Status: DC | PRN
  Administered 2022-10-07: .5 mg via INTRAVENOUS

## 2022-10-07 MED ORDER — NOREPINEPHRINE 4 MG/250ML-% IV SOLN
0.0000 ug/min | INTRAVENOUS | Status: DC
  Administered 2022-10-07: 8 ug/min via INTRAVENOUS

## 2022-10-07 MED ORDER — EPINEPHRINE 1 MG/10ML IJ SOSY
PREFILLED_SYRINGE | INTRAMUSCULAR | Status: DC | PRN
  Administered 2022-10-07: 1 mg via INTRAVENOUS

## 2022-10-07 MED ORDER — FENTANYL CITRATE (PF) 100 MCG/2ML IJ SOLN
INTRAMUSCULAR | Status: DC | PRN
  Administered 2022-10-07: 50 ug via INTRAVENOUS

## 2022-10-07 MED ORDER — CEFAZOLIN SODIUM-DEXTROSE 1-4 GM/50ML-% IV SOLN
INTRAVENOUS | Status: DC | PRN
  Administered 2022-10-07: 2 g via INTRAVENOUS

## 2022-10-07 MED ORDER — ROSUVASTATIN CALCIUM 20 MG PO TABS
20.0000 mg | ORAL_TABLET | Freq: Every day | ORAL | Status: DC
  Administered 2022-10-07 – 2022-10-10 (×4): 20 mg via ORAL
  Filled 2022-10-07 (×4): qty 1

## 2022-10-07 MED ORDER — HEPARIN (PORCINE) 25000 UT/250ML-% IV SOLN
800.0000 [IU]/h | INTRAVENOUS | Status: DC
  Administered 2022-10-07: 850 [IU]/h via INTRAVENOUS
  Administered 2022-10-08: 800 [IU]/h via INTRAVENOUS
  Filled 2022-10-07: qty 250

## 2022-10-07 MED ORDER — PERFLUTREN LIPID MICROSPHERE
1.0000 mL | INTRAVENOUS | Status: AC | PRN
  Administered 2022-10-07: 2 mL via INTRAVENOUS

## 2022-10-07 MED ORDER — FENTANYL CITRATE PF 50 MCG/ML IJ SOSY
12.5000 ug | PREFILLED_SYRINGE | INTRAMUSCULAR | Status: DC | PRN
  Administered 2022-10-07: 12.5 ug via INTRAVENOUS
  Filled 2022-10-07 (×2): qty 1

## 2022-10-07 MED ORDER — ATROPINE SULFATE 1 MG/10ML IJ SOSY
PREFILLED_SYRINGE | INTRAMUSCULAR | Status: AC
  Filled 2022-10-07: qty 10

## 2022-10-07 MED ORDER — SODIUM CHLORIDE 0.9 % IV SOLN
4.0000 ug/kg/min | INTRAVENOUS | Status: DC
  Administered 2022-10-07 (×2): 4 ug/kg/min via INTRAVENOUS
  Filled 2022-10-07 (×3): qty 50

## 2022-10-07 MED ORDER — IOHEXOL 300 MG/ML  SOLN
INTRAMUSCULAR | Status: DC | PRN
  Administered 2022-10-07: 195 mL

## 2022-10-07 MED ORDER — ASPIRIN 81 MG PO TBEC
81.0000 mg | DELAYED_RELEASE_TABLET | Freq: Every day | ORAL | Status: DC
  Administered 2022-10-08 – 2022-10-11 (×4): 81 mg via ORAL
  Filled 2022-10-07 (×4): qty 1

## 2022-10-07 MED ORDER — FUROSEMIDE 10 MG/ML IJ SOLN
40.0000 mg | Freq: Once | INTRAMUSCULAR | Status: AC
  Administered 2022-10-07: 40 mg via INTRAVENOUS
  Filled 2022-10-07: qty 4

## 2022-10-07 MED ORDER — OXYCODONE HCL 5 MG PO TABS
5.0000 mg | ORAL_TABLET | ORAL | Status: DC | PRN
  Administered 2022-10-07: 5 mg via ORAL
  Filled 2022-10-07: qty 1

## 2022-10-07 MED ORDER — FENTANYL CITRATE (PF) 100 MCG/2ML IJ SOLN
INTRAMUSCULAR | Status: AC
  Filled 2022-10-07: qty 2

## 2022-10-07 MED ORDER — ASPIRIN 81 MG PO TBEC: 81.0000 mg | DELAYED_RELEASE_TABLET | Freq: Every day | ORAL | Status: DC

## 2022-10-07 MED ORDER — TICAGRELOR 90 MG PO TABS
90.0000 mg | ORAL_TABLET | Freq: Two times a day (BID) | ORAL | Status: DC
  Filled 2022-10-07: qty 1

## 2022-10-07 NOTE — Progress Notes (Signed)
Orthopedic Tech Progress Note Patient Details:  Bryan Garza 1933/11/28 [id number]  Ortho Devices Type of Ortho Device: Knee Immobilizer Ortho Device/Splint Location: rle Ortho Device/Splint Interventions: Ordered, Application, Adjustment   Post Interventions Patient Tolerated: Well Instructions Provided: Care of device, Adjustment of device  Karolee Stamps 10/07/2022, 2:55 AM

## 2022-10-07 NOTE — Progress Notes (Addendum)
ANTICOAGULATION CONSULT NOTE - Initial Consult  Pharmacy Consult for heparin Indication:  IABP and h/o VTE  Allergies  Allergen Reactions   Tetracyclines & Related Nausea And Vomiting    Flu symptoms    Patient Measurements: Height: 5\' 7"  (170.2 cm) Weight: 84 kg (185 lb 3 oz) IBW/kg (Calculated) : 66.1  Vital Signs: Temp: 96.5 F (35.8 C) (01/21 0400) Temp Source: Axillary (01/21 0400) BP: 113/81 (01/21 0400) Pulse Rate: 74 (01/21 0400)  Labs: Recent Labs    10/06/22 2155 10/06/22 2240  HGB 14.1  --   HCT 42.5  --   PLT 182  --   APTT  --  >200*  LABPROT  --  15.3*  INR  --  1.2  CREATININE 1.78*  --   TROPONINIHS 43*  --     Estimated Creatinine Clearance: 30.9 mL/min (A) (by C-G formula based on SCr of 1.78 mg/dL (H)).   Medical History: Past Medical History:  Diagnosis Date   Arthritis     Medications:  Medications Prior to Admission  Medication Sig Dispense Refill Last Dose   Ashwagandha 125 MG CAPS Take 1 capsule by mouth daily.      donepezil (ARICEPT) 5 MG tablet Take 5 mg by mouth at bedtime.      ELIQUIS 2.5 MG TABS tablet TAKE 1 TABLET BY MOUTH TWICE DAILY 60 tablet 3    famotidine (PEPCID) 40 MG tablet Take 1 tablet by mouth at bedtime.      finasteride (PROSCAR) 5 MG tablet Take 5 mg by mouth daily.      guaiFENesin (MUCINEX) 600 MG 12 hr tablet Take 600 mg by mouth 2 (two) times daily as needed.      HYDROcodone-acetaminophen (NORCO/VICODIN) 5-325 MG tablet Take 1 tablet by mouth every 6 (six) hours as needed for moderate pain or severe pain. (Patient not taking: Reported on 03/27/2022) 30 tablet 0    memantine (NAMENDA) 5 MG tablet Take 5 mg by mouth 2 (two) times daily.      Misc Natural Products (HERBAL ENERGY COMPLEX PO) Take by mouth.      Multiple Vitamin (MULTIVITAMIN WITH MINERALS) TABS tablet Take 1 tablet by mouth daily.      Omega-3 Fatty Acids (OMEGA-3 FISH OIL) 300 MG CAPS Take by mouth.      omeprazole (PRILOSEC) 40 MG capsule  Take 40 mg by mouth daily.      QUEtiapine (SEROQUEL) 50 MG tablet Take 50 mg by mouth at bedtime.      tamsulosin (FLOMAX) 0.4 MG CAPS capsule TAKE 2 CAPSULES BY MOUTH ONCE DAILY. TAKE 30 MINUTES AFTER SAME MEAL EACH DAY.      traZODone (DESYREL) 50 MG tablet Take 50 mg by mouth at bedtime.      triamcinolone cream (KENALOG) 0.1 % Apply 1 application. topically 2 (two) times daily. 30 g 0    Scheduled:   [START ON 10/08/2022] aspirin EC  81 mg Oral Daily   Chlorhexidine Gluconate Cloth  6 each Topical Daily   Infusions:   cangrelor (KENGREAL) 50 mg in sodium chloride 0.9 % 250 mL (0.2 mg/mL) infusion 4 mcg/kg/min (10/07/22 0400)   heparin 850 Units/hr (10/07/22 0400)   norepinephrine (LEVOPHED) Adult infusion 8 mcg/min (10/07/22 0400)    Assessment: 89yo male presented to [facility] c/o diffuse CP, EKG done and code STEMI called >> sent emergently to cath lab where stents and IABP were placed, now transferred to [facility] for further treatment, to continue heparin for IABP.  Heparin initially started in ED at 1100 units/hr after 4,000 units bolus, large 10,000 unit bolus given in lab, and infusion continued >> arrived to [facility] with heparin running at 1000 units/hr; RN reports that pt is oozing at multiple sites, no coag results available other than PTT >200 just minutes after first bolus.  Pt takes Eliquis for h/o VTE but last dose is unclear.  Pt is also on cangrelor.  Goal of Therapy:  Heparin level 0.3-0.5 units/ml aPTT 66-85 seconds Monitor platelets by anticoagulation protocol: Yes   Plan:  Empirically change heparin infusion to 850 units/hr. Monitor heparin levels, aPTT (while Eliquis affects anti-Xa assay), and CBC. F/u ability to take PO anti-Plt and d/c cangrelor.  Wynona Neat, PharmD, BCPS  10/07/2022,4:28 AM

## 2022-10-07 NOTE — Consult Note (Signed)
[facility] Cardiology  CARDIOLOGY CONSULT NOTE  Patient ID: Bryan Garza MRN: [id number] DOB/AGE: 1933-10-27 89 y.o.  Admit date: 10/06/2022 Referring Physician  Primary Physician [facility] Primary Cardiologist  Reason for Consultation anterior STEMI  HPI: Patient is 89 year old gentleman referred for evaluation of new onset chest pain and ECG diagnostic for anterior STEMI.  Patient was usual state of health until earlier this evening when he developed 8 out of 10 substernal chest pain.  Patient presented to [facility] emergency room where initial ECG revealed elevations in leads V2 through V5.  Patient was urgently brought to the cardiac catheterization laboratory where coronary angiography revealed clear the proximal left anterior descending coronary artery.  The patient underwent primary PCI via right radial artery with 2.5 x 26 mm Onyx Frontier drug-eluting stent.  During the procedure, the patient became pulseless requiring brief CPR and was started on Levophed infusion.  Patient also vomited x 2 and received a grams of Ancef to prevent aspiration pneumonia.  Patient was started on IV cangrelor for platelet inhibition.  Patient remained hypotensive during and after procedure despite Levophed infusion.  Intra-aortic aortic balloon pump was placed via right femoral artery, and was started on heparin infusion.  Review of systems complete and found to be negative unless listed above     Past Medical History:  Diagnosis Date   Arthritis     Past Surgical History:  Procedure Laterality Date   MINOR HEMORRHOIDECTOMY     PERIPHERAL VASCULAR THROMBECTOMY Left 03/16/2020   Procedure: PERIPHERAL VASCULAR THROMBECTOMY;  Surgeon: Annice Needy, MD;  Location: [facility] INVASIVE CV LAB;  Service: Cardiovascular;  Laterality: Left;    Medications Prior to Admission  Medication Sig Dispense Refill Last Dose   Ashwagandha 125 MG CAPS Take 1 capsule by mouth daily.      donepezil (ARICEPT) 5 MG tablet Take 5 mg  by mouth at bedtime.      ELIQUIS 2.5 MG TABS tablet TAKE 1 TABLET BY MOUTH TWICE DAILY 60 tablet 3    famotidine (PEPCID) 40 MG tablet Take 1 tablet by mouth at bedtime.      finasteride (PROSCAR) 5 MG tablet Take 5 mg by mouth daily.      guaiFENesin (MUCINEX) 600 MG 12 hr tablet Take 600 mg by mouth 2 (two) times daily as needed.      HYDROcodone-acetaminophen (NORCO/VICODIN) 5-325 MG tablet Take 1 tablet by mouth every 6 (six) hours as needed for moderate pain or severe pain. (Patient not taking: Reported on 03/27/2022) 30 tablet 0    memantine (NAMENDA) 5 MG tablet Take 5 mg by mouth 2 (two) times daily.      Misc Natural Products (HERBAL ENERGY COMPLEX PO) Take by mouth.      Multiple Vitamin (MULTIVITAMIN WITH MINERALS) TABS tablet Take 1 tablet by mouth daily.      Omega-3 Fatty Acids (OMEGA-3 FISH OIL) 300 MG CAPS Take by mouth.      omeprazole (PRILOSEC) 40 MG capsule Take 40 mg by mouth daily.      QUEtiapine (SEROQUEL) 50 MG tablet Take 50 mg by mouth at bedtime.      tamsulosin (FLOMAX) 0.4 MG CAPS capsule TAKE 2 CAPSULES BY MOUTH ONCE DAILY. TAKE 30 MINUTES AFTER SAME MEAL EACH DAY.      traZODone (DESYREL) 50 MG tablet Take 50 mg by mouth at bedtime.      triamcinolone cream (KENALOG) 0.1 % Apply 1 application. topically 2 (two) times daily. 30 g 0  Social History   Socioeconomic History   Marital status: Married    Spouse name: Not on file   Number of children: Not on file   Years of education: Not on file   Highest education level: Not on file  Occupational History   Not on file  Tobacco Use   Smoking status: Former   Smokeless tobacco: Never  Substance and Sexual Activity   Alcohol use: No   Drug use: Never   Sexual activity: Not on file  Other Topics Concern   Not on file  Social History Narrative   Not on file   Social Determinants of Health   Financial Resource Strain: Not on file  Food Insecurity: Not on file  Transportation Needs: Not on file   Physical Activity: Not on file  Stress: Not on file  Social Connections: Not on file  Intimate Partner Violence: Not on file    Family History  Problem Relation Age of Onset   Lung cancer Father       Review of systems complete and found to be negative unless listed above      PHYSICAL EXAM  General: Well developed, well nourished, in no acute distress HEENT:  Normocephalic and atramatic Neck:  No JVD.  Lungs: Clear bilaterally to auscultation and percussion. Heart: HRRR . Normal S1 and S2 without gallops or murmurs.  Abdomen: Bowel sounds are positive, abdomen soft and non-tender  Msk:  Back normal, normal gait. Normal strength and tone for age. Extremities: No clubbing, cyanosis or edema.   Neuro: Alert and oriented X 3. Psych:  Good affect, responds appropriately  Labs:   Lab Results  Component Value Date   WBC 8.6 10/06/2022   HGB 14.1 10/06/2022   HCT 42.5 10/06/2022   MCV 89.5 10/06/2022   PLT 182 10/06/2022    Recent Labs  Lab 10/06/22 2155  NA 137  K 3.5  CL 104  CO2 21*  BUN 26*  CREATININE 1.78*  CALCIUM 9.1  GLUCOSE 130*   No results found for: "CKTOTAL", "CKMB", "CKMBINDEX", "TROPONINI" No results found for: "CHOL" No results found for: "HDL" No results found for: "LDLCALC" No results found for: "TRIG" No results found for: "CHOLHDL" No results found for: "LDLDIRECT"    Radiology: CARDIAC CATHETERIZATION  Result Date: 10/07/2022   Mid RCA lesion is 50% stenosed.   Ost LAD to Prox LAD lesion is 100% stenosed.   Mid LAD lesion is 90% stenosed.   A drug-eluting stent was successfully placed using a STENT ONYX FRONTIER 2.5X26.   Post intervention, there is a 0% residual stenosis.   Post intervention, there is a 0% residual stenosis.   There is mild to moderate left ventricular systolic dysfunction.   LV end diastolic pressure is mildly elevated.   The left ventricular ejection fraction is 35-45% by visual estimate. 1.  Anterior STEMI 2.  Occluded  proximal LAD 3.  Mild to moderate reduced left ventricular function with estimated LV ejection fraction 35 to 40% with anterior apical hypokinesis 4.  Cardiogenic shock 5.  Successful primary PCI with DES proximal LAD 6.  Pulsation of blood pressure with intra-aortic balloon pump Recommendations Transfer to [facility] for post PCI and intra-aortic balloon pump management   DG Chest 2 View  Result Date: 10/06/2022 CLINICAL DATA:  Chest pain. EXAM: CHEST - 2 VIEW COMPARISON:  March 15, 2020 FINDINGS: The heart size and mediastinal contours are within normal limits. Mild, diffuse, chronic appearing increased lung markings are  seen. Very mild atelectasis is noted within the lateral aspects of the bilateral lung bases. There is no evidence of an acute infiltrate, pleural effusion or pneumothorax. The visualized skeletal structures are unremarkable. IMPRESSION: Chronic appearing increased lung markings without acute cardiopulmonary disease. Electronically Signed   By: Virgina Norfolk M.D.   On: 10/06/2022 22:05    EKG: Sinus rhythm with ST elevation in leads V2 through V5  ASSESSMENT AND PLAN:   Anterior STEMI with cardiogenic shock, status post successful primary PCI with DES proximal LAD, placement of intra-aortic balloon pump.  Recommendations  1.  Transfer to [facility] for intra-aortic balloon pump management 2.  Continue IV cangrelor until patient able to take p.o. platelet inhibitor 3.  Heparin infusion    Signed: Isaias Cowman MD,PhD, [facility] 10/07/2022, 12:59 AM

## 2022-10-07 NOTE — Progress Notes (Addendum)
Rounding Note    Patient Name: Veron Senner Date of Encounter: 10/07/2022  [facility] Cardiologist: None   Subjective   Complains of pain in the chest with palpation and movement. Continued to have oozing from IABP site. Tolerated brief trial of 1:3 on IABP and IABP was pulled at bedside this AM.  Inpatient Medications    Scheduled Meds:  [START ON 10/08/2022] aspirin EC  81 mg Oral Daily   Chlorhexidine Gluconate Cloth  6 each Topical Daily   rosuvastatin  20 mg Oral QHS   ticagrelor  90 mg Oral BID   Continuous Infusions:  heparin 800 Units/hr (10/07/22 2028)   norepinephrine (LEVOPHED) Adult infusion Stopped (10/07/22 0542)   PRN Meds: acetaminophen, fentaNYL (SUBLIMAZE) injection, ondansetron (ZOFRAN) IV, mouth rinse, oxyCODONE   Vital Signs    Vitals:   10/07/22 1700 10/07/22 1800 10/07/22 1900 10/07/22 2000  BP: 130/80 124/83 127/77 119/69  Pulse: 76 75 80 71  Resp: 15 16 11 14   Temp:      TempSrc:      SpO2: 97% 96% 98% 98%  Weight:      Height:        Intake/Output Summary (Last 24 hours) at 10/07/2022 2033 Last data filed at 10/07/2022 2000 Gross per 24 hour  Intake 584.97 ml  Output 2670 ml  Net -2085.03 ml      10/07/2022    4:00 AM 10/06/2022   10:27 PM 10/06/2022    9:43 PM  Last 3 Weights  Weight (lbs) 185 lb 3 oz 185 lb 3 oz 210 lb  Weight (kg) 84 kg 84 kg 95.255 kg      Telemetry    NSR, PVCs, runs of NSVT - Personally Reviewed  ECG    NSR, LAD, evolving anterior infarct pattern - Personally Reviewed  Physical Exam   GEN: Elderly male, NAD  Neck: No JVD Cardiac: RRR, 1/6 systolic murmur  Respiratory: Clear to auscultation bilaterally. GI: Soft, nontender, non-distended  MS: Right IABP site with oozing from sheath. Otherwise soft without hematoma Neuro:  Nonfocal  Psych: Normal affect   Labs    High Sensitivity Troponin:   Recent Labs  Lab 10/06/22 2155  TROPONINIHS 43*     Chemistry Recent Labs  Lab  10/06/22 2155 10/07/22 0432  NA 137 137  K 3.5 3.9  CL 104 106  CO2 21* 21*  GLUCOSE 130* 171*  BUN 26* 22  CREATININE 1.78* 1.65*  CALCIUM 9.1 8.2*  MG  --  1.9  GFRNONAA 37* 40*  ANIONGAP 12 10    Lipids  Recent Labs  Lab 10/07/22 0555  CHOL 218*  TRIG 43  HDL 42  LDLCALC 167*  CHOLHDL 5.2    Hematology Recent Labs  Lab 10/06/22 2155 10/07/22 0432  WBC 8.6 11.4*  RBC 4.75 4.28  HGB 14.1 13.2  HCT 42.5 38.2*  MCV 89.5 89.3  MCH 29.7 30.8  MCHC 33.2 34.6  RDW 13.3 13.5  PLT 182 159   Thyroid No results for input(s): "TSH", "FREET4" in the last 168 hours.  BNPNo results for input(s): "BNP", "PROBNP" in the last 168 hours.  DDimer No results for input(s): "DDIMER" in the last 168 hours.   Radiology    ECHOCARDIOGRAM COMPLETE  Result Date: 10/07/2022    ECHOCARDIOGRAM REPORT   Patient Name:   Durward Matranga Date of Exam: 10/07/2022 Medical Rec #:  10/09/2022  Height:       67.0 in Accession #:    [id number]         Weight:       185.2 lb Date of Birth:  July 14, 1934         BSA:          1.957 m Patient Age:    89 years           BP:           125/66 mmHg Patient Gender: M                  HR:           88 bpm. Exam Location:  Inpatient Procedure: 2D Echo, Cardiac Doppler, Color Doppler and Intracardiac            Opacification Agent Indications:    Acute myocardial infarction  History:        Patient has no prior history of Echocardiogram examinations.                 CKD. Hx DVT. IABP.  Sonographer:    Clayton Lefort RDCS (AE) Referring Phys: Clois Dupes  Sonographer Comments: Technically difficult study due to poor echo windows. Patient sensitve to probe pressure. IMPRESSIONS  1. Left ventricular ejection fraction, by estimation, is 30 to 35%. The left ventricle has moderately decreased function. The left ventricle demonstrates regional wall motion abnormalities (see scoring diagram/findings for description). There is mild left ventricular hypertrophy. Left  ventricular diastolic parameters are indeterminate.  2. Right ventricular systolic function was not well visualized. The right ventricular size is not well visualized. Tricuspid regurgitation signal is inadequate for assessing PA pressure.  3. Left atrial size was mildly dilated.  4. The mitral valve is normal in structure. No evidence of mitral valve regurgitation.  5. The aortic valve was not well visualized. Aortic valve regurgitation is not visualized. No aortic stenosis is present. FINDINGS  Left Ventricle: Left ventricular ejection fraction, by estimation, is 30 to 35%. The left ventricle has moderately decreased function. The left ventricle demonstrates regional wall motion abnormalities. Definity contrast agent was given IV to delineate the left ventricular endocardial borders. The left ventricular internal cavity size was normal in size. There is mild left ventricular hypertrophy. Left ventricular diastolic parameters are indeterminate.  LV Wall Scoring: The mid and distal anterior wall, mid and distal anterior septum, apical inferior segment, and apex are akinetic. The entire lateral wall, inferior wall, basal anteroseptal segment, mid inferoseptal segment, basal anterior segment, and basal inferoseptal segment are normal. Right Ventricle: The right ventricular size is not well visualized. Right vetricular wall thickness was not well visualized. Right ventricular systolic function was not well visualized. Tricuspid regurgitation signal is inadequate for assessing PA pressure. Left Atrium: Left atrial size was mildly dilated. Right Atrium: Right atrial size was normal in size. Pericardium: There is no evidence of pericardial effusion. Mitral Valve: The mitral valve is normal in structure. No evidence of mitral valve regurgitation. Tricuspid Valve: The tricuspid valve is normal in structure. Tricuspid valve regurgitation is trivial. Aortic Valve: The aortic valve was not well visualized. Aortic valve  regurgitation is not visualized. No aortic stenosis is present. Aortic valve mean gradient measures 3.0 mmHg. Aortic valve peak gradient measures 5.2 mmHg. Aortic valve area, by VTI measures 1.94 cm. Pulmonic Valve: The pulmonic valve was not well visualized. Pulmonic valve regurgitation is not visualized. Aorta: The aortic root and ascending aorta are structurally normal, with  no evidence of dilitation. IAS/Shunts: The interatrial septum was not well visualized.  LEFT VENTRICLE PLAX 2D LVIDd:         5.00 cm   Diastology LVIDs:         4.10 cm   LV e' medial:    4.57 cm/s LV PW:         1.10 cm   LV E/e' medial:  16.7 LV IVS:        1.10 cm   LV e' lateral:   6.96 cm/s LVOT diam:     2.00 cm   LV E/e' lateral: 10.9 LV SV:         39 LV SV Index:   20 LVOT Area:     3.14 cm  RIGHT VENTRICLE RV Basal diam:  2.50 cm RV S prime:     10.10 cm/s TAPSE (M-mode): 1.0 cm LEFT ATRIUM             Index        RIGHT ATRIUM          Index LA diam:        3.30 cm 1.69 cm/m   RA Area:     8.59 cm LA Vol (A2C):   57.6 ml 29.43 ml/m  RA Volume:   14.60 ml 7.46 ml/m LA Vol (A4C):   74.6 ml 38.12 ml/m LA Biplane Vol: 67.5 ml 34.49 ml/m  AORTIC VALVE AV Area (Vmax):    1.97 cm AV Area (Vmean):   1.88 cm AV Area (VTI):     1.94 cm AV Vmax:           114.00 cm/s AV Vmean:          75.500 cm/s AV VTI:            0.202 m AV Peak Grad:      5.2 mmHg AV Mean Grad:      3.0 mmHg LVOT Vmax:         71.60 cm/s LVOT Vmean:        45.100 cm/s LVOT VTI:          0.125 m LVOT/AV VTI ratio: 0.62  AORTA Ao Root diam: 3.20 cm Ao Asc diam:  3.40 cm MITRAL VALVE MV Area (PHT): 3.61 cm    SHUNTS MV Decel Time: 210 msec    Systemic VTI:  0.12 m MV E velocity: 76.20 cm/s  Systemic Diam: 2.00 cm Oswaldo Milian MD Electronically signed by Oswaldo Milian MD Signature Date/Time: 10/07/2022/3:29:11 PM    Final    DG CHEST PORT 1 VIEW  Result Date: 10/07/2022 CLINICAL DATA:  Follow-up intra-aortic balloon pump. EXAM: PORTABLE CHEST 1  VIEW COMPARISON:  10/06/2022 FINDINGS: Intra-aortic balloon pump marker is identified along the inferior aspect of the aortic arch between the posterior sixth and seventh rib spaces. Normal heart size. No pleural fluid noted. Increased peripheral septal lines identified within the lower lung zones. No airspace opacities. IMPRESSION: 1. Intra-aortic balloon pump marker is along the inferior aspect of the aortic arch. 2. Suspect mild interstitial edema. Electronically Signed   By: Kerby Moors M.D.   On: 10/07/2022 07:53   CARDIAC CATHETERIZATION  Result Date: 10/07/2022   Mid RCA lesion is 50% stenosed.   Ost LAD to Prox LAD lesion is 100% stenosed.   Mid LAD lesion is 90% stenosed.   A drug-eluting stent was successfully placed using a STENT ONYX FRONTIER 2.5X26.   Post intervention, there is a 0% residual stenosis.  Post intervention, there is a 0% residual stenosis.   There is mild to moderate left ventricular systolic dysfunction.   LV end diastolic pressure is mildly elevated.   The left ventricular ejection fraction is 35-45% by visual estimate. 1.  Anterior STEMI 2.  Occluded proximal LAD 3.  Mild to moderate reduced left ventricular function with estimated LV ejection fraction 35 to 40% with anterior apical hypokinesis 4.  Cardiogenic shock 5.  Successful primary PCI with DES proximal LAD 6.  Pulsation of blood pressure with intra-aortic balloon pump Recommendations Transfer to [facility] for post PCI and intra-aortic balloon pump management   DG Chest 2 View  Result Date: 10/06/2022 CLINICAL DATA:  Chest pain. EXAM: CHEST - 2 VIEW COMPARISON:  March 15, 2020 FINDINGS: The heart size and mediastinal contours are within normal limits. Mild, diffuse, chronic appearing increased lung markings are seen. Very mild atelectasis is noted within the lateral aspects of the bilateral lung bases. There is no evidence of an acute infiltrate, pleural effusion or pneumothorax. The visualized skeletal  structures are unremarkable. IMPRESSION: Chronic appearing increased lung markings without acute cardiopulmonary disease. Electronically Signed   By: Virgina Norfolk M.D.   On: 10/06/2022 22:05    Cardiac Studies   TTE 10/07/22: IMPRESSIONS     1. Left ventricular ejection fraction, by estimation, is 30 to 35%. The  left ventricle has moderately decreased function. The left ventricle  demonstrates regional wall motion abnormalities (see scoring  diagram/findings for description). There is mild  left ventricular hypertrophy. Left ventricular diastolic parameters are  indeterminate.   2. Right ventricular systolic function was not well visualized. The right  ventricular size is not well visualized. Tricuspid regurgitation signal is  inadequate for assessing PA pressure.   3. Left atrial size was mildly dilated.   4. The mitral valve is normal in structure. No evidence of mitral valve  regurgitation.   5. The aortic valve was not well visualized. Aortic valve regurgitation  is not visualized. No aortic stenosis is present.   Patient Profile     89 y.o. male with a history of LLE DVT (s/p thrombectomy and PTA; 02/2020) and mild dementia who presented to [facility] with acute onset chest pain and was found to have an anterior STEMI s/p LAD PCI and IABP placement. He is now transferred to the [facility] CICU for continued management.   Assessment & Plan    #Anterior STEMI: #Cardiogenic Shock: Patient presented to OSH with acute chest pain found to have STE in V2-V5 concerning for acute anterior STEMI. Underwent emergent cath, which revealed 100% prox LAD disease s/p PCI with excellent results. Intra procedure, the patient had a PEA arrest requiring CPR with ROSC. Remained hypotensive and IABP was placed. He was subsequently transferred to [facility] for further management. Briefly trialed on 1:3 on IABP this AM and tolerated well. IABP removed at bedside. -IABP removed bedside -Continue ASA 81mg   daily -Continue brilinta 90mg  daily -Continue crestor 20mg  daily -Stop heparin now that IABP removed -Will add BB/ACE/ARB as able  #Acute Systolic HF: EF 40-98% with akinesis of the anterior, anteroseptal, apical inferior and apex in the setting of anterior MI as detailed above. IABP initially in place as detailed above, but tolerated brief wean and was pulled at bedside this AM. Will add GDMT as able for HF pending blood pressures -Will add GDMT as able -IABP removed this AM and tolerated well  #AKI: In the setting of cardiogenic shock as above. Will manage  as above and trend.  CRITICAL CARE TIME: I have spent a total of 50 minutes with patient reviewing hospital notes, telemetry, EKGs, labs and examining the patient as well as establishing an assessment and plan that was discussed with the patient.  > 50% of time was spent in direct patient care. The patient is critically ill with multi-organ system failure and requires high complexity decision making for assessment and support, frequent evaluation and titration of therapies, application of advanced monitoring technologies and extensive interpretation of multiple databases.   For questions or updates, please contact [place] Please consult [email or web address] for contact info under        Signed, Freada Bergeron, MD  10/07/2022, 8:33 PM

## 2022-10-07 NOTE — ED Provider Notes (Signed)
Department of Emergency Medicine CODE BLUE CONSULTATION    Code Blue CONSULT NOTE  Chief Complaint: Cardiac arrest/unresponsive   Level V Caveat: Unresponsive  History of present illness: I was contacted by the hospital for a CODE BLUE cardiac arrest in the Cath Lab.  When I presented to the Cath Lab patient was undergoing cath.  Was not receiving compressions as apparently ROSC had been obtained.  Was initially asked to intubate the patient but on assessment he is awake and responsive.  Blood pressure 654Y systolic.  He is on Levophed infusion.   ROS: Unable to obtain, Level V caveat  Scheduled Meds: Continuous Infusions:  cangrelor (KENGREAL) 50,000 mcg in sodium chloride 0.9 % 250 mL (200 mcg/mL) infusion 4 mcg/kg/min (10/06/22 2350)   heparin 1,100 Units/hr (10/06/22 2249)   norepinephrine (LEVOPHED) 4 mg in dextrose 5 % 250 mL (0.016 mg/mL) infusion 10 mcg/min (10/07/22 0001)   norepinephrine (LEVOPHED) Adult infusion 5 mcg/min (10/06/22 2255)   PRN Meds:.cangrelor (KENGREAL) 50,000 mcg in sodium chloride 0.9 % 250 mL (200 mcg/mL) infusion, cangrelor, docusate sodium, Heparin (Porcine) in NaCl, heparin sodium (porcine), lidocaine (PF), norepinephrine (LEVOPHED) 4 mg in dextrose 5 % 250 mL (0.016 mg/mL) infusion, polyethylene glycol, verapamil Past Medical History:  Diagnosis Date   Arthritis    Past Surgical History:  Procedure Laterality Date   MINOR HEMORRHOIDECTOMY     PERIPHERAL VASCULAR THROMBECTOMY Left 03/16/2020   Procedure: PERIPHERAL VASCULAR THROMBECTOMY;  Surgeon: Algernon Huxley, MD;  Location: [facility];  Service: Cardiovascular;  Laterality: Left;   Social History   Socioeconomic History   Marital status: Married    Spouse name: Not on file   Number of children: Not on file   Years of education: Not on file   Highest education level: Not on file  Occupational History   Not on file  Tobacco Use   Smoking status: Former   Smokeless tobacco:  Never  Substance and Sexual Activity   Alcohol use: No   Drug use: Never   Sexual activity: Not on file  Other Topics Concern   Not on file  Social History Narrative   Not on file   Social Determinants of Health   Financial Resource Strain: Not on file  Food Insecurity: Not on file  Transportation Needs: Not on file  Physical Activity: Not on file  Stress: Not on file  Social Connections: Not on file  Intimate Partner Violence: Not on file   Allergies  Allergen Reactions   Tetracyclines & Related Nausea And Vomiting    Flu symptoms    Last set of Vital Signs (not current) Vitals:   10/06/22 2250 10/06/22 2314  BP: (!) 79/51   Pulse: 65   Resp: (!) 23   Temp:    SpO2: 97% 96%      Physical Exam  Gen: Appears ill Cardiovascular: pulseless  Resp: Patient is breathing spontaneously, unlabored Abd: nondistended  Neuro: GCS 13, eyes are closed, patient is confused Musculoskeletal: No deformity  Skin: Patient looks gray   CRITICAL CARE Performed by: Madelin Headings Total critical care time: 10 Critical care time was exclusive of separately billable procedures and treating other patients. Critical care was necessary to treat or prevent imminent or life-threatening deterioration. Critical care was time spent personally by me on the following activities: development of treatment plan with patient and/or surrogate as well as nursing, discussions with consultants, evaluation of patient's response to treatment, examination of patient, obtaining history  from patient or surrogate, ordering and performing treatments and interventions, ordering and review of laboratory studies, ordering and review of radiographic studies, pulse oximetry and re-evaluation of patient's condition.  Cardiopulmonary Resuscitation (CPR) Procedure Note   I arrived to the Cath Lab after CODE BLUE was called.  Apparently patient lost pulses.  When I initially arrived to the Cath Lab there was no CPR  or bagging happening.  Unclear if patient had a pulse or not but when I put my lead on and then went to evaluate the patient he was awake and breathing spontaneously.  I asked him to obtain a blood pressure and it was in the 828 systolic.  Patient currently undergoing PCI to the LAD.  Given patient breathing spontaneously and protecting his I did not feel that it was necessary to intubate him emergently especially since he is unstable.   Rada Hay, MD 10/07/22 [phone]

## 2022-10-07 NOTE — Progress Notes (Signed)
Rounding Note    Patient Name: Bryan Garza Date of Encounter: 10/07/2022  [place] Cardiologist: None   Subjective   Denies chest pain. He is a little confused but in good spirits and denies pain.   Inpatient Medications    Scheduled Meds:  [START ON 10/08/2022] aspirin EC  81 mg Oral Daily   Chlorhexidine Gluconate Cloth  6 each Topical Daily   ticagrelor  90 mg Oral BID   Continuous Infusions:  heparin     norepinephrine (LEVOPHED) Adult infusion Stopped (10/07/22 0542)   PRN Meds: acetaminophen, fentaNYL (SUBLIMAZE) injection, ondansetron (ZOFRAN) IV, oxyCODONE   Vital Signs    Vitals:   10/07/22 0800 10/07/22 0841 10/07/22 0900 10/07/22 1000  BP: 110/76  126/81 125/70  Pulse: 82 83 90 87  Resp: 18 19 16 18   Temp: 97.9 F (36.6 C)     TempSrc: Oral     SpO2: 98% 95% 98% 97%  Weight:      Height:        Intake/Output Summary (Last 24 hours) at 10/07/2022 1018 Last data filed at 10/07/2022 0900 Gross per 24 hour  Intake 461.27 ml  Output 400 ml  Net 61.27 ml      10/07/2022    4:00 AM 10/06/2022   10:27 PM 10/06/2022    9:43 PM  Last 3 Weights  Weight (lbs) 185 lb 3 oz 185 lb 3 oz 210 lb  Weight (kg) 84 kg 84 kg 95.255 kg      Telemetry    Nsr - Personally Reviewed  ECG   NSR with anterior STEMI- Personally Reviewed  Physical Exam   GEN: No acute distress.   Neck: No JVD Cardiac: RRR, no murmurs, rubs, or gallops.  Respiratory: Clear to auscultation bilaterally. GI: Soft, nontender, non-distended  MS: No edema; No deformity. IABP in right fem artery.  Neuro:  Nonfocal  Psych: Normal affect   Labs    High Sensitivity Troponin:   Recent Labs  Lab 10/06/22 2155  TROPONINIHS 43*     Chemistry Recent Labs  Lab 10/06/22 2155 10/07/22 0432  NA 137 137  K 3.5 3.9  CL 104 106  CO2 21* 21*  GLUCOSE 130* 171*  BUN 26* 22  CREATININE 1.78* 1.65*  CALCIUM 9.1 8.2*  MG  --  1.9  GFRNONAA 37* 40*  ANIONGAP 12 10     Lipids  Recent Labs  Lab 10/07/22 0555  CHOL 218*  TRIG 43  HDL 42  LDLCALC 167*  CHOLHDL 5.2    Hematology Recent Labs  Lab 10/06/22 2155 10/07/22 0432  WBC 8.6 11.4*  RBC 4.75 4.28  HGB 14.1 13.2  HCT 42.5 38.2*  MCV 89.5 89.3  MCH 29.7 30.8  MCHC 33.2 34.6  RDW 13.3 13.5  PLT 182 159   Thyroid No results for input(s): "TSH", "FREET4" in the last 168 hours.  BNPNo results for input(s): "BNP", "PROBNP" in the last 168 hours.  DDimer No results for input(s): "DDIMER" in the last 168 hours.   Radiology    DG CHEST PORT 1 VIEW  Result Date: 10/07/2022 CLINICAL DATA:  Follow-up intra-aortic balloon pump. EXAM: PORTABLE CHEST 1 VIEW COMPARISON:  10/06/2022 FINDINGS: Intra-aortic balloon pump marker is identified along the inferior aspect of the aortic arch between the posterior sixth and seventh rib spaces. Normal heart size. No pleural fluid noted. Increased peripheral septal lines identified within the lower lung zones. No airspace opacities. IMPRESSION: 1. Intra-aortic  balloon pump marker is along the inferior aspect of the aortic arch. 2. Suspect mild interstitial edema. Electronically Signed   By: Kerby Moors M.D.   On: 10/07/2022 07:53   CARDIAC CATHETERIZATION  Result Date: 10/07/2022   Mid RCA lesion is 50% stenosed.   Ost LAD to Prox LAD lesion is 100% stenosed.   Mid LAD lesion is 90% stenosed.   A drug-eluting stent was successfully placed using a STENT ONYX FRONTIER 2.5X26.   Post intervention, there is a 0% residual stenosis.   Post intervention, there is a 0% residual stenosis.   There is mild to moderate left ventricular systolic dysfunction.   LV end diastolic pressure is mildly elevated.   The left ventricular ejection fraction is 35-45% by visual estimate. 1.  Anterior STEMI 2.  Occluded proximal LAD 3.  Mild to moderate reduced left ventricular function with estimated LV ejection fraction 35 to 40% with anterior apical hypokinesis 4.  Cardiogenic shock 5.   Successful primary PCI with DES proximal LAD 6.  Pulsation of blood pressure with intra-aortic balloon pump Recommendations Transfer to [facility] for post PCI and intra-aortic balloon pump management   DG Chest 2 View  Result Date: 10/06/2022 CLINICAL DATA:  Chest pain. EXAM: CHEST - 2 VIEW COMPARISON:  March 15, 2020 FINDINGS: The heart size and mediastinal contours are within normal limits. Mild, diffuse, chronic appearing increased lung markings are seen. Very mild atelectasis is noted within the lateral aspects of the bilateral lung bases. There is no evidence of an acute infiltrate, pleural effusion or pneumothorax. The visualized skeletal structures are unremarkable. IMPRESSION: Chronic appearing increased lung markings without acute cardiopulmonary disease. Electronically Signed   By: Virgina Norfolk M.D.   On: 10/06/2022 22:05    Cardiac Studies   See above  Patient Profile     89 y.o. male admitted with anterior STEMI, s/p PCI and IABP  Assessment & Plan    Anterior STEMI - complicated by shock, s/p IABP. He is better but is dependent on IABP for maintaining his BP. Prognosis is guarded. Hopefully we can wean IABP over the coming days. Bleeding -he was oozing from his access sites. His IV cangrelor and heparin were held. He will be switched to brilinta.  3. Dementia - this is mild at baseline. He is at risk for sudden worsening.  For questions or updates, please contact [place] Please consult [email or web address] for contact info under     Signed, Cristopher Peru, MD  10/07/2022, 10:18 AM

## 2022-10-07 NOTE — Progress Notes (Addendum)
At beginning of shift, patient bleeding excessively from IV sites, TR band site, and IABP site. Cangrelor and heparin drips infusing. Skains MD paged. Orders received to stop heparin, give Brilinta load (after talking with pharmacy), and then stop Cangrelor. Bleeding much better. Heparin to be resumed at 1100.  Lovena Le MD to bedside for morning rounds. Notified of bleeding issues as well as ectopy, episode of pain in his left chest, and urinary retention. Order received to insert foley catheter. Skains MD at bedside shortly after and notified of same.

## 2022-10-07 NOTE — H&P (Signed)
Cardiology Admission History and Physical   Patient ID: Bryan Garza MRN: [id number]; DOB: [birth year]   Admission date: 10/07/2022  PCP:  Gayla Doss, MD   [facility] Providers Cardiologist:  None        Chief Complaint:  Chest Pain  History of Present Illness:   Bryan Garza is a 89 y.o. male with a history of LLE DVT (s/p thrombectomy and PTA; 02/2020) and memory loss who presented to [facility] with acute onset chest pain and was found to have an anterior STEMI s/p LAD PCI and IABP placement. He is now transferred to the [facility] CICU for continued management.   Patient was in his USOH until approximately 30 minutes prior to his ER presentation when he suddenly developed 8/10 chest pain. He then presented to the ED and found to have ST elevation in V2 through V5.  Patient was urgently brought to the cardiac catheterization laboratory where coronary angiography revealed proximal left anterior descending coronary artery disease.  The patient underwent PCI of the LAD via right radial artery with 2.5 x 26 mm Onyx Frontier drug-eluting stent.   Intraprocedure, the patient became pulseless requiring brief CPR and was started on norepinephrine infusion. He also became nauseated and was empirically given Ancef to cover for aspiration pneumonia. Because his PO intake was unreliable, he was started on Cangerelor infusion Because the patient remained hypotensive during and after the procedure despite pressors, an IABP was placed via right femoral artery, and was started on heparin infusion. He was then transferred to [facility] for post STEMI balloon pump management.   At the time of my interview, the patient states that he is comfortable and denies any associated chest pain, exertional chest pressure/discomfort, dyspnea/tachypnea, paroxysmal nocturnal dyspnea/orthopnea, irregular heart beat/palpitations, presyncope/syncope, lower extremity edema, claudication, or  abdominal distention.    Past Medical History:  Diagnosis Date   Arthritis     Past Surgical History:  Procedure Laterality Date   MINOR HEMORRHOIDECTOMY     PERIPHERAL VASCULAR THROMBECTOMY Left 03/16/2020   Procedure: PERIPHERAL VASCULAR THROMBECTOMY;  Surgeon: Annice Needy, MD;  Location: [facility] INVASIVE CV LAB;  Service: Cardiovascular;  Laterality: Left;     Medications Prior to Admission: Prior to Admission medications   Medication Sig Start Date End Date Taking? Authorizing Provider  Ashwagandha 125 MG CAPS Take 1 capsule by mouth daily.    [provider]  donepezil (ARICEPT) 5 MG tablet Take 5 mg by mouth at bedtime. 02/23/21   [provider]  ELIQUIS 2.5 MG TABS tablet TAKE 1 TABLET BY MOUTH TWICE DAILY 07/09/22   Georgiana Spinner, NP  famotidine (PEPCID) 40 MG tablet Take 1 tablet by mouth at bedtime. 10/23/21   [provider]  finasteride (PROSCAR) 5 MG tablet Take 5 mg by mouth daily. 02/28/21   [provider]  guaiFENesin (MUCINEX) 600 MG 12 hr tablet Take 600 mg by mouth 2 (two) times daily as needed.    [provider]  HYDROcodone-acetaminophen (NORCO/VICODIN) 5-325 MG tablet Take 1 tablet by mouth every 6 (six) hours as needed for moderate pain or severe pain. Patient not taking: Reported on 03/27/2022 03/18/20   Swayze, Ava, DO  memantine (NAMENDA) 5 MG tablet Take 5 mg by mouth 2 (two) times daily. 03/22/22   [provider]  Misc Natural Products (HERBAL ENERGY COMPLEX PO) Take by mouth.    [provider]  Multiple Vitamin (MULTIVITAMIN WITH MINERALS) TABS tablet  Take 1 tablet by mouth daily.    [provider]  Omega-3 Fatty Acids (OMEGA-3 FISH OIL) 300 MG CAPS Take by mouth.    [provider]  omeprazole (PRILOSEC) 40 MG capsule Take 40 mg by mouth daily. 03/03/21   [provider]  QUEtiapine (SEROQUEL) 50 MG tablet Take 50 mg by mouth at bedtime. 03/26/22   [provider]   tamsulosin (FLOMAX) 0.4 MG CAPS capsule TAKE 2 CAPSULES BY MOUTH ONCE DAILY. TAKE 30 MINUTES AFTER SAME MEAL EACH DAY. 01/30/21   [provider]  traZODone (DESYREL) 50 MG tablet Take 50 mg by mouth at bedtime. 02/23/21   [provider]  triamcinolone cream (KENALOG) 0.1 % Apply 1 application. topically 2 (two) times daily. 02/10/22   Johnn Hai, PA-C     Allergies:    Allergies  Allergen Reactions   Tetracyclines & Related Nausea And Vomiting    Flu symptoms    Social History:   Social History   Socioeconomic History   Marital status: Married    Spouse name: Not on file   Number of children: Not on file   Years of education: Not on file   Highest education level: Not on file  Occupational History   Not on file  Tobacco Use   Smoking status: Former   Smokeless tobacco: Never  Substance and Sexual Activity   Alcohol use: No   Drug use: Never   Sexual activity: Not on file  Other Topics Concern   Not on file  Social History Narrative   Not on file   Social Determinants of Health   Financial Resource Strain: Not on file  Food Insecurity: Not on file  Transportation Needs: Not on file  Physical Activity: Not on file  Stress: Not on file  Social Connections: Not on file  Intimate Partner Violence: Not on file    Family History:   The patient's family history includes Lung cancer in his father.    ROS:  Please see the history of present illness.  All other ROS reviewed and negative.     Physical Exam/Data:   Vitals:   10/07/22 0330 10/07/22 0340 10/07/22 0350 10/07/22 0400  BP: 93/63 105/75 113/63 113/81  Pulse: 84 90 85 74  Resp: 13 20 14 16   Temp:    (!) 96.5 F (35.8 C)  TempSrc:    Axillary  SpO2: 100% 96% 98% 98%  Weight:    84 kg  Height:    5\' 7"  (1.702 m)    Intake/Output Summary (Last 24 hours) at 10/07/2022 0448 Last data filed at 10/07/2022 0400 Gross per 24 hour  Intake 45.8 ml  Output --  Net 45.8 ml       10/07/2022    4:00 AM 10/06/2022   10:27 PM 10/06/2022    9:43 PM  Last 3 Weights  Weight (lbs) 185 lb 3 oz 185 lb 3 oz 210 lb  Weight (kg) 84 kg 84 kg 95.255 kg     Body mass index is 29 kg/m.  General:  Well nourished, well developed, in no acute distress HEENT: normal Neck: no JVD Vascular: No carotid bruits; Distal pulses 2+ bilaterally   Cardiac:  normal S1, S2; RRR; difficult to decipher heart sounds given IABP Lungs:  clear to auscultation anteriorly, no wheezing, rhonchi or rales  Abd: soft, nontender/nondistended, no hepatomegaly  Ext: nonedematous Musculoskeletal:  No deformities, R groin with some mild bleedthrough on gauze surrounding IABP cannula, but  no hematoma Skin: warm and dry  Neuro:  No focal abnormalities noted Psych:  Normal affect   Relevant CV Studies: [facility] - 10/06/22   Mid RCA lesion is 50% stenosed.   Ost LAD to Prox LAD lesion is 100% stenosed.   Mid LAD lesion is 90% stenosed.   A drug-eluting stent was successfully placed using a STENT ONYX FRONTIER 2.5X26.   Post intervention, there is a 0% residual stenosis.   Post intervention, there is a 0% residual stenosis.   There is mild to moderate left ventricular systolic dysfunction.   LV end diastolic pressure is mildly elevated.   The left ventricular ejection fraction is 35-45% by visual estimate.  Laboratory Data:  High Sensitivity Troponin:   Recent Labs  Lab 10/06/22 2155  TROPONINIHS 43*      Chemistry Recent Labs  Lab 10/06/22 2155  NA 137  K 3.5  CL 104  CO2 21*  GLUCOSE 130*  BUN 26*  CREATININE 1.78*  CALCIUM 9.1  GFRNONAA 37*  ANIONGAP 12    No results for input(s): "PROT", "ALBUMIN", "AST", "ALT", "ALKPHOS", "BILITOT" in the last 168 hours. Lipids No results for input(s): "CHOL", "TRIG", "HDL", "LABVLDL", "LDLCALC", "CHOLHDL" in the last 168 hours. Hematology Recent Labs  Lab 10/06/22 2155  WBC 8.6  RBC 4.75  HGB 14.1  HCT 42.5  MCV 89.5  MCH 29.7  MCHC 33.2  RDW  13.3  PLT 182   Thyroid No results for input(s): "TSH", "FREET4" in the last 168 hours. BNPNo results for input(s): "BNP", "PROBNP" in the last 168 hours.  DDimer No results for input(s): "DDIMER" in the last 168 hours.   Radiology/Studies:  CARDIAC CATHETERIZATION  Result Date: 10/07/2022   Mid RCA lesion is 50% stenosed.   Ost LAD to Prox LAD lesion is 100% stenosed.   Mid LAD lesion is 90% stenosed.   A drug-eluting stent was successfully placed using a STENT ONYX FRONTIER 2.5X26.   Post intervention, there is a 0% residual stenosis.   Post intervention, there is a 0% residual stenosis.   There is mild to moderate left ventricular systolic dysfunction.   LV end diastolic pressure is mildly elevated.   The left ventricular ejection fraction is 35-45% by visual estimate. 1.  Anterior STEMI 2.  Occluded proximal LAD 3.  Mild to moderate reduced left ventricular function with estimated LV ejection fraction 35 to 40% with anterior apical hypokinesis 4.  Cardiogenic shock 5.  Successful primary PCI with DES proximal LAD 6.  Pulsation of blood pressure with intra-aortic balloon pump Recommendations Transfer to [facility] for post PCI and intra-aortic balloon pump management   DG Chest 2 View  Result Date: 10/06/2022 CLINICAL DATA:  Chest pain. EXAM: CHEST - 2 VIEW COMPARISON:  March 15, 2020 FINDINGS: The heart size and mediastinal contours are within normal limits. Mild, diffuse, chronic appearing increased lung markings are seen. Very mild atelectasis is noted within the lateral aspects of the bilateral lung bases. There is no evidence of an acute infiltrate, pleural effusion or pneumothorax. The visualized skeletal structures are unremarkable. IMPRESSION: Chronic appearing increased lung markings without acute cardiopulmonary disease. Electronically Signed   By: Aram Candela M.D.   On: 10/06/2022 22:05     Assessment and Plan:   Bryan Garza is a 89 y.o. male with a history of  LLE DVT (s/p thrombectomy and PTA; 02/2020) and memory loss who presented to [facility] with acute onset chest pain and was  found to have an anterior STEMI s/p LAD PCI and IABP placement. He is now transferred to the [facility] CICU for continued management.   # Anterior STEMI s/p PCI LAD # CAD # Cardiogenic Shock requiring IABP with mild to moderate LV dysfunction on V-gram - Cont IABP at 1:1, will begin to wean as tolerated during the day - Continue Norepinephrine infusion; wean as tolerated - Patient to be NPO except for meds - Cont heparin IV infusion protocol - Cont Cangrelor infusion until able to reliably take PO meds - ASA 91mg  daily - TTE in AM - Aggressive risk factor modification with glycemic control, smoking cessation, and GDMT (to be started once more objective data obtained; statin, ACEi, beta-blocker)             - Lipid panel and A1c pending  # Memory Loss - Holdiing home medications while patient is NPO    Risk Assessment/Risk Scores:    TIMI Risk Score for ST  Elevation MI:   The patient's TIMI risk score is 9, which indicates a 35.9% risk of all cause mortality at 30 days.        Severity of Illness: The appropriate patient status for this patient is INPATIENT. Inpatient status is judged to be reasonable and necessary in order to provide the required intensity of service to ensure the patient's safety. The patient's presenting symptoms, physical exam findings, and initial radiographic and laboratory data in the context of their chronic comorbidities is felt to place them at high risk for further clinical deterioration. Furthermore, it is not anticipated that the patient will be medically stable for discharge from the hospital within 2 midnights of admission.   * I certify that at the point of admission it is my clinical judgment that the patient will require inpatient hospital care spanning beyond 2 midnights from the point of admission due to high intensity of  service, high risk for further deterioration and high frequency of surveillance required.*   For questions or updates, please contact [place] Please consult [email or web address] for contact info under     Signed, Clois Dupes, MD  10/07/2022 4:48 AM

## 2022-10-07 NOTE — Progress Notes (Signed)
ANTICOAGULATION CONSULT NOTE   Pharmacy Consult for heparin Indication:  IABP and h/o VTE  Allergies  Allergen Reactions   Tetracyclines & Related Nausea And Vomiting    Flu symptoms    Patient Measurements: Height: 5\' 7"  (170.2 cm) Weight: 84 kg (185 lb 3 oz) IBW/kg (Calculated) : 66.1  Vital Signs: Temp: 98.6 F (37 C) (01/21 1549) Temp Source: Oral (01/21 1549) BP: 119/69 (01/21 2000) Pulse Rate: 71 (01/21 2000)  Labs: Recent Labs    10/06/22 2155 10/06/22 2240 10/07/22 0432 10/07/22 1909  HGB 14.1  --  13.2  --   HCT 42.5  --  38.2*  --   PLT 182  --  159  --   APTT  --  >200* >200* 87*  LABPROT  --  15.3*  --   --   INR  --  1.2  --   --   HEPARINUNFRC  --   --   --  >1.10*  CREATININE 1.78*  --  1.65*  --   TROPONINIHS 43*  --   --   --      Estimated Creatinine Clearance: 33.3 mL/min (A) (by C-G formula based on SCr of 1.65 mg/dL (H)).   Medical History: Past Medical History:  Diagnosis Date   Arthritis     Medications:  Medications Prior to Admission  Medication Sig Dispense Refill Last Dose   Ashwagandha 125 MG CAPS Take 1 capsule by mouth daily.      donepezil (ARICEPT) 5 MG tablet Take 5 mg by mouth at bedtime.      ELIQUIS 2.5 MG TABS tablet TAKE 1 TABLET BY MOUTH TWICE DAILY 60 tablet 3    famotidine (PEPCID) 40 MG tablet Take 1 tablet by mouth at bedtime.      finasteride (PROSCAR) 5 MG tablet Take 5 mg by mouth daily.      guaiFENesin (MUCINEX) 600 MG 12 hr tablet Take 600 mg by mouth 2 (two) times daily as needed.      HYDROcodone-acetaminophen (NORCO/VICODIN) 5-325 MG tablet Take 1 tablet by mouth every 6 (six) hours as needed for moderate pain or severe pain. (Patient not taking: Reported on 03/27/2022) 30 tablet 0    memantine (NAMENDA) 5 MG tablet Take 5 mg by mouth 2 (two) times daily.      Misc Natural Products (HERBAL ENERGY COMPLEX PO) Take by mouth.      Multiple Vitamin (MULTIVITAMIN WITH MINERALS) TABS tablet Take 1 tablet by  mouth daily.      Omega-3 Fatty Acids (OMEGA-3 FISH OIL) 300 MG CAPS Take by mouth.      omeprazole (PRILOSEC) 40 MG capsule Take 40 mg by mouth daily.      QUEtiapine (SEROQUEL) 50 MG tablet Take 50 mg by mouth at bedtime.      tamsulosin (FLOMAX) 0.4 MG CAPS capsule TAKE 2 CAPSULES BY MOUTH ONCE DAILY. TAKE 30 MINUTES AFTER SAME MEAL EACH DAY.      traZODone (DESYREL) 50 MG tablet Take 50 mg by mouth at bedtime.      triamcinolone cream (KENALOG) 0.1 % Apply 1 application. topically 2 (two) times daily. 30 g 0    Scheduled:   [START ON 10/08/2022] aspirin EC  81 mg Oral Daily   Chlorhexidine Gluconate Cloth  6 each Topical Daily   rosuvastatin  20 mg Oral QHS   ticagrelor  90 mg Oral BID   Infusions:   heparin 800 Units/hr (10/07/22 2028)   norepinephrine (LEVOPHED) Adult  infusion Stopped (10/07/22 0542)    Assessment: 89yo male presented to [facility] c/o diffuse CP, EKG done and code STEMI called >> sent emergently to cath lab where stents and IABP were placed, now transferred to [facility] for further treatment, to continue heparin for IABP.  Heparin initially started in ED at 1100 units/hr after 4,000 units bolus, large 10,000 unit bolus given in lab, and infusion continued >> arrived to [facility] with heparin running at 1000 units/hr; RN reports that pt is oozing at multiple sites, no coag results available other than PTT >200 just minutes after first bolus.  Pt takes Eliquis for h/o VTE but last dose is unclear.  Pt is also on cangrelor.  PM f/u: Cangrelor now off, pt received Brilinta.  Aptt just very slightly above goal range.  Heparin level falsely elevated from PTA apixaban.  No overt bleeding or complications noted.   Goal of Therapy:  Heparin level 0.3-0.5 units/ml aPTT 66-85 seconds Monitor platelets by anticoagulation protocol: Yes   Plan:  Reduce IV heparin to 800 units/hr. Daily heparin level, aPTT, and CBC.  [place] Crane, Roylene Reason, BCCP Clinical Pharmacist  10/07/2022 8:31  PM   [facility] pharmacy phone numbers are listed on [email or web address]

## 2022-10-07 NOTE — Progress Notes (Signed)
   Currently oozing/bleeding significantly from puncture sites. Can not control adequately.  IABP in place TR band in place  LAD stent, STEMI transfer from [facility]  IV cangrelor  IV Heparin (elevated PTT/ACT)  There was concern about ability to swallow pill earlier during cath, hence cangrelor. Nurse team believes now that he can take PO.   Given significant bleeding will do the following:  Stop Heparin IV for now. Reinitiate once bleeding stable (IABP in place) Give Brilinta PO now. Use 90mg  PO dose since has been loaded with antiplatelet cangrelor and he has significant bleeding.  If successful at PO Brilinta, will stop cangrelor 30 minutes post PO Brilinta. Discuss with pharmacy team as well.  Repeat CBC   Candee Furbish, MD

## 2022-10-07 NOTE — Progress Notes (Signed)
Paged Kathlen Mody PA in regards to urine output of 15 mL per hour for 2 hours. Order received for 40 mg IV lasix.

## 2022-10-07 NOTE — Progress Notes (Signed)
  Echocardiogram 2D Echocardiogram has been performed.  Bryan Garza 10/07/2022, 2:16 PM

## 2022-10-08 ENCOUNTER — Encounter: Payer: Self-pay | Admitting: Cardiology

## 2022-10-08 ENCOUNTER — Other Ambulatory Visit (HOSPITAL_COMMUNITY): Payer: Self-pay

## 2022-10-08 ENCOUNTER — Inpatient Hospital Stay (HOSPITAL_COMMUNITY): Payer: PPO

## 2022-10-08 DIAGNOSIS — R570 Cardiogenic shock: Secondary | ICD-10-CM

## 2022-10-08 DIAGNOSIS — I5021 Acute systolic (congestive) heart failure: Secondary | ICD-10-CM

## 2022-10-08 DIAGNOSIS — I2102 ST elevation (STEMI) myocardial infarction involving left anterior descending coronary artery: Secondary | ICD-10-CM | POA: Diagnosis not present

## 2022-10-08 LAB — HEPATIC FUNCTION PANEL
ALT: 53 U/L — ABNORMAL HIGH (ref 0–44)
AST: 193 U/L — ABNORMAL HIGH (ref 15–41)
Albumin: 3 g/dL — ABNORMAL LOW (ref 3.5–5.0)
Alkaline Phosphatase: 46 U/L (ref 38–126)
Bilirubin, Direct: 0.2 mg/dL (ref 0.0–0.2)
Indirect Bilirubin: 0.6 mg/dL (ref 0.3–0.9)
Total Bilirubin: 0.8 mg/dL (ref 0.3–1.2)
Total Protein: 5.3 g/dL — ABNORMAL LOW (ref 6.5–8.1)

## 2022-10-08 LAB — BASIC METABOLIC PANEL
Anion gap: 3 — ABNORMAL LOW (ref 5–15)
BUN: 23 mg/dL (ref 8–23)
CO2: 24 mmol/L (ref 22–32)
Calcium: 8.3 mg/dL — ABNORMAL LOW (ref 8.9–10.3)
Chloride: 109 mmol/L (ref 98–111)
Creatinine, Ser: 1.69 mg/dL — ABNORMAL HIGH (ref 0.61–1.24)
GFR, Estimated: 39 mL/min — ABNORMAL LOW (ref >60)
Glucose, Bld: 122 mg/dL — ABNORMAL HIGH (ref 70–99)
Potassium: 4.1 mmol/L (ref 3.5–5.1)
Sodium: 136 mmol/L (ref 135–145)

## 2022-10-08 LAB — CBC
HCT: 36.9 % — ABNORMAL LOW (ref 39.0–52.0)
Hemoglobin: 12.3 g/dL — ABNORMAL LOW (ref 13.0–17.0)
MCH: 30.1 pg (ref 26.0–34.0)
MCHC: 33.3 g/dL (ref 30.0–36.0)
MCV: 90.2 fL (ref 80.0–100.0)
Platelets: 101 10*3/uL — ABNORMAL LOW (ref 150–400)
RBC: 4.09 MIL/uL — ABNORMAL LOW (ref 4.22–5.81)
RDW: 13.8 % (ref 11.5–15.5)
WBC: 8.2 10*3/uL (ref 4.0–10.5)
nRBC: 0 % (ref 0.0–0.2)

## 2022-10-08 LAB — APTT: aPTT: 92 seconds — ABNORMAL HIGH (ref 24–36)

## 2022-10-08 LAB — MAGNESIUM: Magnesium: 2.3 mg/dL (ref 1.7–2.4)

## 2022-10-08 LAB — HEPARIN LEVEL (UNFRACTIONATED): Heparin Unfractionated: >1.1 IU/mL — ABNORMAL HIGH (ref 0.30–0.70)

## 2022-10-08 MED ORDER — FENTANYL CITRATE PF 50 MCG/ML IJ SOSY
25.0000 ug | PREFILLED_SYRINGE | Freq: Once | INTRAMUSCULAR | Status: AC
  Administered 2022-10-08: 25 ug via INTRAVENOUS

## 2022-10-08 MED ORDER — LIDOCAINE HCL (PF) 1 % IJ SOLN
INTRAMUSCULAR | Status: AC
  Filled 2022-10-08: qty 10

## 2022-10-08 MED ORDER — FENTANYL CITRATE PF 50 MCG/ML IJ SOSY: 25.0000 ug | PREFILLED_SYRINGE | Freq: Once | INTRAMUSCULAR | Status: DC

## 2022-10-08 MED ORDER — "THROMBI-PAD 3""X3"" EX PADS"
1.0000 | MEDICATED_PAD | Freq: Once | CUTANEOUS | Status: DC
  Filled 2022-10-08: qty 1

## 2022-10-08 MED ORDER — HEPARIN (PORCINE) 25000 UT/250ML-% IV SOLN
750.0000 [IU]/h | INTRAVENOUS | Status: DC
  Administered 2022-10-08: 700 [IU]/h via INTRAVENOUS

## 2022-10-08 NOTE — TOC Benefit Eligibility Note (Signed)
Patient Advocate Encounter  Insurance verification completed.    The patient is currently admitted and upon discharge could be taking Entresto 24-26 mg.  The current 30 day co-pay is $47.00.   The patient is currently admitted and upon discharge could be taking Farxiga 10 mg.  The current 30 day co-pay is $47.00.   The patient is currently admitted and upon discharge could be taking Jardiance 10 mg.  The current 30 day co-pay is $47.00.   The patient is insured through [employer] Part D   Kylee Umana, CPHT Pharmacy Patient Advocate Specialist [facility] Patient Advocate Team Direct Number: [phone]  Fax: [phone]       

## 2022-10-08 NOTE — Progress Notes (Signed)
Rounding Note    Patient Name: Bryan Garza Date of Encounter: 10/09/2022  [facility] Cardiologist: None   Subjective   Awake and sitting in a chair this morning. Denies chest pain. IABP site c/d/I without hematoma.   IABP pulled yesterday which he tolerated well Remains HD stable this AM with MAPs 70s, SBP 100-110s Restarted on heparin given history of DVT Cr improving to 1.38  Inpatient Medications    Scheduled Meds:  aspirin EC  81 mg Oral Daily   Chlorhexidine Gluconate Cloth  6 each Topical Daily   rosuvastatin  20 mg Oral QHS   Thrombi-Pad  1 each Topical Once   ticagrelor  90 mg Oral BID   Continuous Infusions:  heparin 750 Units/hr (10/09/22 0700)   PRN Meds: acetaminophen, fentaNYL (SUBLIMAZE) injection, ondansetron (ZOFRAN) IV, mouth rinse, oxyCODONE   Vital Signs    Vitals:   10/09/22 0500 10/09/22 0600 10/09/22 0700 10/09/22 0752  BP: 118/73 105/82 (!) 109/59   Pulse: 72 74 83   Resp: 12 19 16    Temp:    98.2 F (36.8 C)  TempSrc:    Oral  SpO2: 96% 91% 93%   Weight: 78.7 kg     Height:        Intake/Output Summary (Last 24 hours) at 10/09/2022 0823 Last data filed at 10/09/2022 0700 Gross per 24 hour  Intake 114.8 ml  Output 850 ml  Net -735.2 ml      10/09/2022    5:00 AM 10/07/2022    4:00 AM 10/06/2022   10:27 PM  Last 3 Weights  Weight (lbs) 173 lb 8 oz 185 lb 3 oz 185 lb 3 oz  Weight (kg) 78.7 kg 84 kg 84 kg      Telemetry    NSR, occasional PVCs - Personally Reviewed  ECG    No new tracing - Personally Reviewed  Physical Exam   GEN: Elderly male, NAD, sitting up in a chair Neck: No JVD Cardiac: RRR, 1/6 systolic murmur  Respiratory: Clear to auscultation bilaterally. GI: Soft, nontender, non-distended  MS: Right IABP site c/d/I without hematoma Neuro:  Nonfocal  Psych: Calm this morning  Labs    High Sensitivity Troponin:   Recent Labs  Lab 10/06/22 2155  TROPONINIHS 43*     Chemistry Recent  Labs  Lab 10/07/22 0432 10/08/22 0425 10/09/22 0109  NA 137 136 137  K 3.9 4.1 4.0  CL 106 109 106  CO2 21* 24 21*  GLUCOSE 171* 122* 111*  BUN 22 23 23   CREATININE 1.65* 1.69* 1.38*  CALCIUM 8.2* 8.3* 8.6*  MG 1.9 2.3  --   PROT  --  5.3*  --   ALBUMIN  --  3.0*  --   AST  --  193*  --   ALT  --  53*  --   ALKPHOS  --  46  --   BILITOT  --  0.8  --   GFRNONAA 40* 39* 50*  ANIONGAP 10 3* 10    Lipids  Recent Labs  Lab 10/07/22 0555  CHOL 218*  TRIG 43  HDL 42  LDLCALC 167*  CHOLHDL 5.2    Hematology Recent Labs  Lab 10/06/22 2155 10/07/22 0432 10/08/22 0425  WBC 8.6 11.4* 8.2  RBC 4.75 4.28 4.09*  HGB 14.1 13.2 12.3*  HCT 42.5 38.2* 36.9*  MCV 89.5 89.3 90.2  MCH 29.7 30.8 30.1  MCHC 33.2 34.6 33.3  RDW 13.3 13.5  13.8  PLT 182 159 101*   Thyroid No results for input(s): "TSH", "FREET4" in the last 168 hours.  BNPNo results for input(s): "BNP", "PROBNP" in the last 168 hours.  DDimer No results for input(s): "DDIMER" in the last 168 hours.   Radiology    DG Chest Port 1 View  Result Date: 10/08/2022 CLINICAL DATA:  [id number] On intra-aortic balloon pump assist [id number] EXAM: PORTABLE CHEST 1 VIEW COMPARISON:  October 07, 2022 FINDINGS: The cardiomediastinal silhouette is unchanged in contour.IABP metallic marker projects at the approximate level of T7 over the descending thoracic aorta. No pleural effusion. No pneumothorax. Similar mild interstitial prominence and mild peribronchial cuffing. IMPRESSION: IABP metallic marker projects at the approximate level of T7 over the descending thoracic aorta. Electronically Signed   By: Valentino Saxon M.D.   On: 10/08/2022 07:45   ECHOCARDIOGRAM COMPLETE  Result Date: 10/07/2022    ECHOCARDIOGRAM REPORT   Patient Name:   Bryan Garza Date of Exam: 10/07/2022 Medical Rec #:  [id number]          Height:       67.0 in Accession #:    [id number]         Weight:       185.2 lb Date of Birth:  [birth year]         BSA:           1.957 m Patient Age:    89 years           BP:           125/66 mmHg Patient Gender: M                  HR:           88 bpm. Exam Location:  Inpatient Procedure: 2D Echo, Cardiac Doppler, Color Doppler and Intracardiac            Opacification Agent Indications:    Acute myocardial infarction  History:        Patient has no prior history of Echocardiogram examinations.                 CKD. Hx DVT. IABP.  Sonographer:    Clayton Lefort RDCS (AE) Referring Phys: Clois Dupes  Sonographer Comments: Technically difficult study due to poor echo windows. Patient sensitve to probe pressure. IMPRESSIONS  1. Left ventricular ejection fraction, by estimation, is 30 to 35%. The left ventricle has moderately decreased function. The left ventricle demonstrates regional wall motion abnormalities (see scoring diagram/findings for description). There is mild left ventricular hypertrophy. Left ventricular diastolic parameters are indeterminate.  2. Right ventricular systolic function was not well visualized. The right ventricular size is not well visualized. Tricuspid regurgitation signal is inadequate for assessing PA pressure.  3. Left atrial size was mildly dilated.  4. The mitral valve is normal in structure. No evidence of mitral valve regurgitation.  5. The aortic valve was not well visualized. Aortic valve regurgitation is not visualized. No aortic stenosis is present. FINDINGS  Left Ventricle: Left ventricular ejection fraction, by estimation, is 30 to 35%. The left ventricle has moderately decreased function. The left ventricle demonstrates regional wall motion abnormalities. Definity contrast agent was given IV to delineate the left ventricular endocardial borders. The left ventricular internal cavity size was normal in size. There is mild left ventricular hypertrophy. Left ventricular diastolic parameters are indeterminate.  LV Wall Scoring: The mid and distal anterior wall, mid and distal anterior septum, apical  inferior segment, and apex are akinetic. The entire lateral wall, inferior wall, basal anteroseptal segment, mid inferoseptal segment, basal anterior segment, and basal inferoseptal segment are normal. Right Ventricle: The right ventricular size is not well visualized. Right vetricular wall thickness was not well visualized. Right ventricular systolic function was not well visualized. Tricuspid regurgitation signal is inadequate for assessing PA pressure. Left Atrium: Left atrial size was mildly dilated. Right Atrium: Right atrial size was normal in size. Pericardium: There is no evidence of pericardial effusion. Mitral Valve: The mitral valve is normal in structure. No evidence of mitral valve regurgitation. Tricuspid Valve: The tricuspid valve is normal in structure. Tricuspid valve regurgitation is trivial. Aortic Valve: The aortic valve was not well visualized. Aortic valve regurgitation is not visualized. No aortic stenosis is present. Aortic valve mean gradient measures 3.0 mmHg. Aortic valve peak gradient measures 5.2 mmHg. Aortic valve area, by VTI measures 1.94 cm. Pulmonic Valve: The pulmonic valve was not well visualized. Pulmonic valve regurgitation is not visualized. Aorta: The aortic root and ascending aorta are structurally normal, with no evidence of dilitation. IAS/Shunts: The interatrial septum was not well visualized.  LEFT VENTRICLE PLAX 2D LVIDd:         5.00 cm   Diastology LVIDs:         4.10 cm   LV e' medial:    4.57 cm/s LV PW:         1.10 cm   LV E/e' medial:  16.7 LV IVS:        1.10 cm   LV e' lateral:   6.96 cm/s LVOT diam:     2.00 cm   LV E/e' lateral: 10.9 LV SV:         39 LV SV Index:   20 LVOT Area:     3.14 cm  RIGHT VENTRICLE RV Basal diam:  2.50 cm RV S prime:     10.10 cm/s TAPSE (M-mode): 1.0 cm LEFT ATRIUM             Index        RIGHT ATRIUM          Index LA diam:        3.30 cm 1.69 cm/m   RA Area:     8.59 cm LA Vol (A2C):   57.6 ml 29.43 ml/m  RA Volume:   14.60  ml 7.46 ml/m LA Vol (A4C):   74.6 ml 38.12 ml/m LA Biplane Vol: 67.5 ml 34.49 ml/m  AORTIC VALVE AV Area (Vmax):    1.97 cm AV Area (Vmean):   1.88 cm AV Area (VTI):     1.94 cm AV Vmax:           114.00 cm/s AV Vmean:          75.500 cm/s AV VTI:            0.202 m AV Peak Grad:      5.2 mmHg AV Mean Grad:      3.0 mmHg LVOT Vmax:         71.60 cm/s LVOT Vmean:        45.100 cm/s LVOT VTI:          0.125 m LVOT/AV VTI ratio: 0.62  AORTA Ao Root diam: 3.20 cm Ao Asc diam:  3.40 cm MITRAL VALVE MV Area (PHT): 3.61 cm    SHUNTS MV Decel Time: 210 msec    Systemic VTI:  0.12 m MV E velocity: 76.20 cm/s  Systemic Diam: 2.00 cm Oswaldo Milian MD Electronically signed by Oswaldo Milian MD Signature Date/Time: 10/07/2022/3:29:11 PM    Final     Cardiac Studies   TTE 10/07/22: IMPRESSIONS     1. Left ventricular ejection fraction, by estimation, is 30 to 35%. The  left ventricle has moderately decreased function. The left ventricle  demonstrates regional wall motion abnormalities (see scoring  diagram/findings for description). There is mild  left ventricular hypertrophy. Left ventricular diastolic parameters are  indeterminate.   2. Right ventricular systolic function was not well visualized. The right  ventricular size is not well visualized. Tricuspid regurgitation signal is  inadequate for assessing PA pressure.   3. Left atrial size was mildly dilated.   4. The mitral valve is normal in structure. No evidence of mitral valve  regurgitation.   5. The aortic valve was not well visualized. Aortic valve regurgitation  is not visualized. No aortic stenosis is present.   Patient Profile     89 y.o. male with a history of LLE DVT (s/p thrombectomy and PTA; 02/2020) and mild dementia who presented to [facility] with acute onset chest pain and was found to have an anterior STEMI s/p LAD PCI and IABP placement. He was subsequently transferred to [facility] for further evaluation.     Assessment & Plan    #Anterior STEMI: #Cardiogenic Shock: Patient presented to OSH with acute chest pain found to have STE in V2-V5 concerning for acute anterior STEMI. Underwent emergent cath, which revealed 100% prox LAD disease s/p PCI with excellent results. Intra procedure, the patient had a PEA arrest requiring CPR with ROSC. Remained hypotensive and IABP was placed. He was subsequently transferred to [facility] for further management. IABP removed 1/22 which he tolerated well. Feeling better today. Can transfer out of ICU. -Transfer out of ICU -IABP removed 1/22 which he tolerated well -Continue ASA 81mg  daily -Continue brilinta 90mg  daily; will change to plavix once we restart his apixaban -Continue crestor 20mg  daily -Will add BB/ACE/ARB as able; BP too soft currently  #Acute Systolic HF: EF 79-02% with akinesis of the anterior, anteroseptal, apical inferior and apex in the setting of anterior MI as detailed above. IABP initially in place as detailed above, but tolerated brief wean and was pulled at bedside on 1/22. Will add GDMT as able for HF pending blood pressures -Will add GDMT as able  #AKI: In the setting of cardiogenic shock as above. Currently improving.  #History of LE DVT: On apxiaban at home. -Change heparin to apixaban today pending CBC -Change brilinta to plavix once resumed on apixaban  #Dementia: Remains intermittently confused. Has done well with Actuary. Answering questions appropriately for me today.   Plan to transfer out of ICU today. Hopefully can be discharged tomorrow.    For questions or updates, please contact [place] Please consult [email or web address] for contact info under        Signed, Freada Bergeron, MD  10/09/2022, 8:23 AM

## 2022-10-08 NOTE — Progress Notes (Signed)
Heparin gtt paused per MD due to continued bleeding from IABP site. To be restarted in two hours at lower dose of 600 units.

## 2022-10-08 NOTE — Progress Notes (Signed)
ANTICOAGULATION CONSULT NOTE   Pharmacy Consult for heparin Indication: history VTE  Allergies  Allergen Reactions   Tetracyclines & Related Nausea And Vomiting    Flu symptoms    Patient Measurements: Height: 5\' 7"  (170.2 cm) Weight: 84 kg (185 lb 3 oz) IBW/kg (Calculated) : 66.1  Vital Signs: Temp: 98 F (36.7 C) (01/22 1119) Temp Source: Oral (01/22 1119) BP: 142/111 (01/22 1200) Pulse Rate: 90 (01/22 1200)  Labs: Recent Labs    10/06/22 2155 10/06/22 2155 10/06/22 2240 10/07/22 0432 10/07/22 1909 10/08/22 0425  HGB 14.1  --   --  13.2  --  12.3*  HCT 42.5  --   --  38.2*  --  36.9*  PLT 182  --   --  159  --  101*  APTT  --    < > >200* >200* 87* 92*  LABPROT  --   --  15.3*  --   --   --   INR  --   --  1.2  --   --   --   HEPARINUNFRC  --   --   --   --  >1.10* >1.10*  CREATININE 1.78*  --   --  1.65*  --  1.69*  TROPONINIHS 43*  --   --   --   --   --    < > = values in this interval not displayed.     Estimated Creatinine Clearance: 32.5 mL/min (A) (by C-G formula based on SCr of 1.69 mg/dL (H)).   Medical History: Past Medical History:  Diagnosis Date   Arthritis     Medications:  Medications Prior to Admission  Medication Sig Dispense Refill Last Dose   donepezil (ARICEPT) 10 MG tablet Take 10 mg by mouth at bedtime.   Past Week   ELIQUIS 2.5 MG TABS tablet TAKE 1 TABLET BY MOUTH TWICE DAILY 60 tablet 3 10/06/2022 at Night   famotidine (PEPCID) 40 MG tablet Take 40 mg by mouth at bedtime.   Past Week   finasteride (PROSCAR) 5 MG tablet Take 5 mg by mouth daily.   Past Week   memantine (NAMENDA) 5 MG tablet Take 5 mg by mouth 2 (two) times daily.   Past Week   Multiple Vitamin (MULTIVITAMIN WITH MINERALS) TABS tablet Take 1 tablet by mouth daily.   Past Week   omeprazole (PRILOSEC) 40 MG capsule Take 40 mg by mouth daily.   Past Week   QUEtiapine (SEROQUEL) 50 MG tablet Take 50 mg by mouth at bedtime.   Past Week   tamsulosin (FLOMAX) 0.4 MG  CAPS capsule Take 0.8 mg by mouth daily.   Past Week   Scheduled:   aspirin EC  81 mg Oral Daily   Chlorhexidine Gluconate Cloth  6 each Topical Daily   rosuvastatin  20 mg Oral QHS   Thrombi-Pad  1 each Topical Once   ticagrelor  90 mg Oral BID   Infusions:   norepinephrine (LEVOPHED) Adult infusion Stopped (10/07/22 0542)    Assessment: 89yo male [facility] to [facility] c/o diffuse CP, EKG done and code STEMI called >> sent emergently to cath lab where stents and IABP were placed, now transferred to [facility] for further treatment, to continue heparin for IABP. He is now s/p IABP removal this am and heparin was discontinued due to bleeding at the insertion site  Pt takes Eliquis for h/o VTE but last dose is unclear.  Plans are to resume heparin and likely to  resume apixaban 1/22. Will maintain an aptt goal of 66-85 -aPTT 92 this am on 800 units/hr -IABP removed 10am today   Goal of Therapy:  Heparin level 0.3-0.5 units/ml aPTT 66-85 seconds Monitor platelets by anticoagulation protocol: Yes   Plan:  -resume heparin 700 units/hr at 4pm -aPTT in 8 hrs  Hildred Laser, PharmD Clinical Pharmacist **Pharmacist phone directory can now be found on [email or web address] ([email or web address]).  Listed under [place].

## 2022-10-08 NOTE — Progress Notes (Signed)
IABP removed by Dr. Haroldine Laws. Manual pressure take over after he held 10 minutes. Pressure continued for a total of 30 minutes. Site level 0, no s+s of hematoma. Anchoring suture removed.    Tegaderm dressing applied, bedrest instructions given.   Right dp and pt pulses present with doppler.

## 2022-10-09 DIAGNOSIS — I825Y9 Chronic embolism and thrombosis of unspecified deep veins of unspecified proximal lower extremity: Secondary | ICD-10-CM

## 2022-10-09 DIAGNOSIS — F02A11 Dementia in other diseases classified elsewhere, mild, with agitation: Secondary | ICD-10-CM

## 2022-10-09 DIAGNOSIS — G309 Alzheimer's disease, unspecified: Secondary | ICD-10-CM | POA: Diagnosis not present

## 2022-10-09 DIAGNOSIS — I5021 Acute systolic (congestive) heart failure: Secondary | ICD-10-CM | POA: Diagnosis not present

## 2022-10-09 DIAGNOSIS — R570 Cardiogenic shock: Secondary | ICD-10-CM | POA: Diagnosis not present

## 2022-10-09 DIAGNOSIS — I2102 ST elevation (STEMI) myocardial infarction involving left anterior descending coronary artery: Secondary | ICD-10-CM | POA: Diagnosis not present

## 2022-10-09 LAB — BASIC METABOLIC PANEL
Anion gap: 10 (ref 5–15)
BUN: 23 mg/dL (ref 8–23)
CO2: 21 mmol/L — ABNORMAL LOW (ref 22–32)
Calcium: 8.6 mg/dL — ABNORMAL LOW (ref 8.9–10.3)
Chloride: 106 mmol/L (ref 98–111)
Creatinine, Ser: 1.38 mg/dL — ABNORMAL HIGH (ref 0.61–1.24)
GFR, Estimated: 50 mL/min — ABNORMAL LOW (ref >60)
Glucose, Bld: 111 mg/dL — ABNORMAL HIGH (ref 70–99)
Potassium: 4 mmol/L (ref 3.5–5.1)
Sodium: 137 mmol/L (ref 135–145)

## 2022-10-09 LAB — CBC
HCT: 33.1 % — ABNORMAL LOW (ref 39.0–52.0)
Hemoglobin: 11.6 g/dL — ABNORMAL LOW (ref 13.0–17.0)
MCH: 30.9 pg (ref 26.0–34.0)
MCHC: 35 g/dL (ref 30.0–36.0)
MCV: 88 fL (ref 80.0–100.0)
Platelets: 92 10*3/uL — ABNORMAL LOW (ref 150–400)
RBC: 3.76 MIL/uL — ABNORMAL LOW (ref 4.22–5.81)
RDW: 13.8 % (ref 11.5–15.5)
WBC: 7.6 10*3/uL (ref 4.0–10.5)
nRBC: 0 % (ref 0.0–0.2)

## 2022-10-09 LAB — APTT: aPTT: 45 seconds — ABNORMAL HIGH (ref 24–36)

## 2022-10-09 LAB — LIPOPROTEIN A (LPA): Lipoprotein (a): 47.4 nmol/L — ABNORMAL HIGH (ref <75.0)

## 2022-10-09 MED ORDER — APIXABAN 2.5 MG PO TABS
2.5000 mg | ORAL_TABLET | Freq: Two times a day (BID) | ORAL | Status: DC
  Administered 2022-10-09 – 2022-10-11 (×5): 2.5 mg via ORAL
  Filled 2022-10-09 (×5): qty 1

## 2022-10-09 MED ORDER — CLOPIDOGREL BISULFATE 75 MG PO TABS
75.0000 mg | ORAL_TABLET | Freq: Every day | ORAL | Status: DC
  Administered 2022-10-10 – 2022-10-11 (×2): 75 mg via ORAL
  Filled 2022-10-09 (×2): qty 1

## 2022-10-09 MED ORDER — CLOPIDOGREL BISULFATE 300 MG PO TABS
600.0000 mg | ORAL_TABLET | Freq: Once | ORAL | Status: AC
  Administered 2022-10-09: 600 mg via ORAL
  Filled 2022-10-09: qty 2

## 2022-10-09 NOTE — Discharge Instructions (Addendum)
Information about your medication: Plavix (anti-platelet agent)  Generic Name (Brand): clopidogrel (Plavix), once daily medication  PURPOSE: You are taking this medication along with aspirin to lower your chance of having a heart attack, stroke, or blood clots in your heart stent. These can be fatal. Plavix and aspirin help prevent platelets from sticking together and forming a clot that can block an artery or your stent.   Common SIDE EFFECTS you may experience include: bruising or bleeding more easily, shortness of breath  Do not stop taking PLAVIX without talking to the doctor who prescribes it for you. People who are treated with a stent and stop taking Plavix too soon, have a higher risk of getting a blood clot in the stent, having a heart attack, or dying. If you stop Plavix because of bleeding, or for other reasons, your risk of a heart attack or stroke may increase.   Avoid taking NSAID agents or anti-inflammatory medications such as ibuprofen, naproxen given increased bleed risk with plavix - can use acetaminophen (Tylenol) if needed for pain.  Avoid taking over the counter stomach medications omeprazole (Prilosec) or esomeprazole (Nexium) since these do interact and make plavix less effective - ask your pharmacist or doctor for alterative agents if needed for heartburn or GERD.   Tell all of your doctors and dentists that you are taking Plavix. They should talk to the doctor who prescribed Plavix for you before you have any surgery or invasive procedure.   Contact your health care provider if you experience: severe or uncontrollable bleeding, pink/red/brown urine, vomiting blood or vomit that looks like "coffee grounds", red or black stools (looks like tar), coughing up blood or blood clots ----------------------------------------------------------------------------------------------------------------------    De-escalation of Triple Therapy Post-PCI  Underwent cardiac  catheterization with placement of drug-eluting stent on 10/06/2022. Plan for triple therapy with aspirin 81 mg and clopidogrel (Plavix) in addition to oral anticoagulation using apixaban (Eliquis). Plan to discontinue aspirin on 11/05/2022.

## 2022-10-09 NOTE — Progress Notes (Signed)
ANTICOAGULATION CONSULT NOTE - Follow Up Consult  Pharmacy Consult for heparin Indication:  h/o VTE  Labs: Recent Labs    10/06/22 2155 10/06/22 2155 10/06/22 2240 10/07/22 0432 10/07/22 1909 10/08/22 0425 10/09/22 0109  HGB 14.1  --   --  13.2  --  12.3*  --   HCT 42.5  --   --  38.2*  --  36.9*  --   PLT 182  --   --  159  --  101*  --   APTT  --    < > >200* >200* 87* 92* 45*  LABPROT  --   --  15.3*  --   --   --   --   INR  --   --  1.2  --   --   --   --   HEPARINUNFRC  --   --   --   --  >1.10* >1.10*  --   CREATININE 1.78*  --   --  1.65*  --  1.69* 1.38*  TROPONINIHS 43*  --   --   --   --   --   --    < > = values in this interval not displayed.    Assessment: 89yo male subtherapeutic on heparin after resumed at lower rate; no infusion issues or signs of bleeding per RN.  Goal of Therapy:  aPTT 66-85 seconds   Plan:  Will increase heparin infusion slightly to 750 units/hr and check PTT in 8 hours.    Wynona Neat, PharmD, BCPS  10/09/2022,6:40 AM

## 2022-10-09 NOTE — Progress Notes (Incomplete)
Rounding Note    Patient Name: Bryan Garza Date of Encounter: 10/09/2022  [place] Cardiologist: None   Subjective   ***  Inpatient Medications    Scheduled Meds:  apixaban  2.5 mg Oral BID   aspirin EC  81 mg Oral Daily   Chlorhexidine Gluconate Cloth  6 each Topical Daily   [START ON 10/10/2022] clopidogrel  75 mg Oral Daily   rosuvastatin  20 mg Oral QHS   Thrombi-Pad  1 each Topical Once   Continuous Infusions:   PRN Meds: acetaminophen, fentaNYL (SUBLIMAZE) injection, ondansetron (ZOFRAN) IV, mouth rinse, oxyCODONE   Vital Signs    Vitals:   10/09/22 1559 10/09/22 1600 10/09/22 1700 10/09/22 1800  BP:  120/66 (!) 100/53 (!) 103/56  Pulse:  76 78 79  Resp:  17 20 17   Temp: 97.7 F (36.5 C)     TempSrc: Oral     SpO2:  96% 96% 97%  Weight:      Height:        Intake/Output Summary (Last 24 hours) at 10/09/2022 2006 Last data filed at 10/09/2022 1800 Gross per 24 hour  Intake 77.02 ml  Output 830 ml  Net -752.98 ml       10/09/2022    5:00 AM 10/07/2022    4:00 AM 10/06/2022   10:27 PM  Last 3 Weights  Weight (lbs) 173 lb 8 oz 185 lb 3 oz 185 lb 3 oz  Weight (kg) 78.7 kg 84 kg 84 kg      Telemetry    NSR, occasional PVCs - Personally Reviewed  ECG    No new tracing - Personally Reviewed  Physical Exam   GEN: Elderly male, NAD, sitting up in a chair Neck: No JVD Cardiac: RRR, 1/6 systolic murmur  Respiratory: Clear to auscultation bilaterally. GI: Soft, nontender, non-distended  MS: Right IABP site c/d/I without hematoma Neuro:  Nonfocal  Psych: Calm this morning  Labs    High Sensitivity Troponin:   Recent Labs  Lab 10/06/22 2155  TROPONINIHS 43*      Chemistry Recent Labs  Lab 10/07/22 0432 10/08/22 0425 10/09/22 0109  NA 137 136 137  K 3.9 4.1 4.0  CL 106 109 106  CO2 21* 24 21*  GLUCOSE 171* 122* 111*  BUN 22 23 23   CREATININE 1.65* 1.69* 1.38*  CALCIUM 8.2* 8.3* 8.6*  MG 1.9 2.3  --   PROT   --  5.3*  --   ALBUMIN  --  3.0*  --   AST  --  193*  --   ALT  --  53*  --   ALKPHOS  --  46  --   BILITOT  --  0.8  --   GFRNONAA 40* 39* 50*  ANIONGAP 10 3* 10     Lipids  Recent Labs  Lab 10/07/22 0555  CHOL 218*  TRIG 43  HDL 42  LDLCALC 167*  CHOLHDL 5.2     Hematology Recent Labs  Lab 10/07/22 0432 10/08/22 0425 10/09/22 0109  WBC 11.4* 8.2 7.6  RBC 4.28 4.09* 3.76*  HGB 13.2 12.3* 11.6*  HCT 38.2* 36.9* 33.1*  MCV 89.3 90.2 88.0  MCH 30.8 30.1 30.9  MCHC 34.6 33.3 35.0  RDW 13.5 13.8 13.8  PLT 159 101* 92*    Thyroid No results for input(s): "TSH", "FREET4" in the last 168 hours.  BNPNo results for input(s): "BNP", "PROBNP" in the last 168 hours.  DDimer No  results for input(s): "DDIMER" in the last 168 hours.   Radiology    DG Chest Port 1 View  Result Date: 10/08/2022 CLINICAL DATA:  [id number] On intra-aortic balloon pump assist [id number] EXAM: PORTABLE CHEST 1 VIEW COMPARISON:  October 07, 2022 FINDINGS: The cardiomediastinal silhouette is unchanged in contour.IABP metallic marker projects at the approximate level of T7 over the descending thoracic aorta. No pleural effusion. No pneumothorax. Similar mild interstitial prominence and mild peribronchial cuffing. IMPRESSION: IABP metallic marker projects at the approximate level of T7 over the descending thoracic aorta. Electronically Signed   By: Valentino Saxon M.D.   On: 10/08/2022 07:45    Cardiac Studies   TTE 10/07/22: IMPRESSIONS     1. Left ventricular ejection fraction, by estimation, is 30 to 35%. The  left ventricle has moderately decreased function. The left ventricle  demonstrates regional wall motion abnormalities (see scoring  diagram/findings for description). There is mild  left ventricular hypertrophy. Left ventricular diastolic parameters are  indeterminate.   2. Right ventricular systolic function was not well visualized. The right  ventricular size is not well visualized.  Tricuspid regurgitation signal is  inadequate for assessing PA pressure.   3. Left atrial size was mildly dilated.   4. The mitral valve is normal in structure. No evidence of mitral valve  regurgitation.   5. The aortic valve was not well visualized. Aortic valve regurgitation  is not visualized. No aortic stenosis is present.   Patient Profile     89 y.o. male with a history of LLE DVT (s/p thrombectomy and PTA; 02/2020) and mild dementia who presented to [facility] with acute onset chest pain and was found to have an anterior STEMI s/p LAD PCI and IABP placement. He was subsequently transferred to [facility] for further evaluation.    Assessment & Plan    #Anterior STEMI: #Cardiogenic Shock: Patient presented to OSH with acute chest pain found to have STE in V2-V5 concerning for acute anterior STEMI. Underwent emergent cath, which revealed 100% prox LAD disease s/p PCI with excellent results. Intra procedure, the patient had a PEA arrest requiring CPR with ROSC. Remained hypotensive and IABP was placed. He was subsequently transferred to [facility] for further management. IABP removed 1/22 which he tolerated well. Currently, *** -Awaiting transfer out of ICU -IABP removed 1/22 which he tolerated well -Continue ASA 81mg  daily -Brilinta changed to plavix given need for apixaban -Continue crestor 20mg  daily -Will add BB/ACE/ARB as able; BP too soft currently  #Acute Systolic HF: EF 93-81% with akinesis of the anterior, anteroseptal, apical inferior and apex in the setting of anterior MI as detailed above. IABP initially in place as detailed above, but tolerated brief wean and was pulled at bedside on 1/22. Will add GDMT as able for HF pending blood pressures -Will add GDMT as able  #AKI: In the setting of cardiogenic shock as above. Currently improving.  #History of LE DVT: On apxiaban at home. -Resumed on apixaban  #Dementia: Remains intermittently confused. Has done well with Academic librarian. Answering questions appropriately for me today.    For questions or updates, please contact [place] Please consult [email or web address] for contact info under        Signed, Freada Bergeron, MD  10/09/2022, 8:06 PM

## 2022-10-10 DIAGNOSIS — I2102 ST elevation (STEMI) myocardial infarction involving left anterior descending coronary artery: Secondary | ICD-10-CM | POA: Diagnosis not present

## 2022-10-10 DIAGNOSIS — I5021 Acute systolic (congestive) heart failure: Secondary | ICD-10-CM | POA: Diagnosis not present

## 2022-10-10 DIAGNOSIS — G309 Alzheimer's disease, unspecified: Secondary | ICD-10-CM | POA: Diagnosis not present

## 2022-10-10 DIAGNOSIS — F02A11 Dementia in other diseases classified elsewhere, mild, with agitation: Secondary | ICD-10-CM | POA: Diagnosis not present

## 2022-10-10 LAB — CBC
HCT: 31.9 % — ABNORMAL LOW (ref 39.0–52.0)
Hemoglobin: 10.8 g/dL — ABNORMAL LOW (ref 13.0–17.0)
MCH: 29.8 pg (ref 26.0–34.0)
MCHC: 33.9 g/dL (ref 30.0–36.0)
MCV: 87.9 fL (ref 80.0–100.0)
Platelets: 102 10*3/uL — ABNORMAL LOW (ref 150–400)
RBC: 3.63 MIL/uL — ABNORMAL LOW (ref 4.22–5.81)
RDW: 13.5 % (ref 11.5–15.5)
WBC: 6.5 10*3/uL (ref 4.0–10.5)
nRBC: 0 % (ref 0.0–0.2)

## 2022-10-10 LAB — BASIC METABOLIC PANEL
Anion gap: 8 (ref 5–15)
BUN: 26 mg/dL — ABNORMAL HIGH (ref 8–23)
CO2: 20 mmol/L — ABNORMAL LOW (ref 22–32)
Calcium: 8.3 mg/dL — ABNORMAL LOW (ref 8.9–10.3)
Chloride: 110 mmol/L (ref 98–111)
Creatinine, Ser: 1.4 mg/dL — ABNORMAL HIGH (ref 0.61–1.24)
GFR, Estimated: 49 mL/min — ABNORMAL LOW (ref >60)
Glucose, Bld: 102 mg/dL — ABNORMAL HIGH (ref 70–99)
Potassium: 4.2 mmol/L (ref 3.5–5.1)
Sodium: 138 mmol/L (ref 135–145)

## 2022-10-10 MED ORDER — CARVEDILOL 3.125 MG PO TABS
3.1250 mg | ORAL_TABLET | Freq: Two times a day (BID) | ORAL | Status: DC
  Administered 2022-10-10 – 2022-10-11 (×3): 3.125 mg via ORAL
  Filled 2022-10-10 (×3): qty 1

## 2022-10-10 MED ORDER — FINASTERIDE 5 MG PO TABS
5.0000 mg | ORAL_TABLET | Freq: Every day | ORAL | Status: DC
  Administered 2022-10-10 – 2022-10-11 (×2): 5 mg via ORAL
  Filled 2022-10-10 (×2): qty 1

## 2022-10-10 MED ORDER — PANTOPRAZOLE SODIUM 40 MG PO TBEC
40.0000 mg | DELAYED_RELEASE_TABLET | Freq: Every day | ORAL | Status: DC
  Administered 2022-10-10 – 2022-10-11 (×2): 40 mg via ORAL
  Filled 2022-10-10 (×2): qty 1

## 2022-10-10 MED ORDER — QUETIAPINE FUMARATE 50 MG PO TABS
50.0000 mg | ORAL_TABLET | Freq: Every day | ORAL | Status: DC
  Administered 2022-10-10: 50 mg via ORAL
  Filled 2022-10-10: qty 1

## 2022-10-10 MED ORDER — DONEPEZIL HCL 10 MG PO TABS
10.0000 mg | ORAL_TABLET | Freq: Every day | ORAL | Status: DC
  Administered 2022-10-10: 10 mg via ORAL
  Filled 2022-10-10: qty 1

## 2022-10-10 MED ORDER — FAMOTIDINE 20 MG PO TABS
40.0000 mg | ORAL_TABLET | Freq: Every day | ORAL | Status: DC
  Administered 2022-10-10: 40 mg via ORAL
  Filled 2022-10-10: qty 2

## 2022-10-10 MED ORDER — TAMSULOSIN HCL 0.4 MG PO CAPS
0.4000 mg | ORAL_CAPSULE | Freq: Every day | ORAL | Status: DC
  Administered 2022-10-10 – 2022-10-11 (×2): 0.4 mg via ORAL
  Filled 2022-10-10 (×2): qty 1

## 2022-10-10 NOTE — Progress Notes (Addendum)
Rounding Note    Patient Name: Bryan Garza Date of Encounter: 10/10/2022  [facility] Cardiologist: None   Subjective   Denies any CP or SOB.   Inpatient Medications    Scheduled Meds:  apixaban  2.5 mg Oral BID   aspirin EC  81 mg Oral Daily   Chlorhexidine Gluconate Cloth  6 each Topical Daily   clopidogrel  75 mg Oral Daily   rosuvastatin  20 mg Oral QHS   Thrombi-Pad  1 each Topical Once   Continuous Infusions:  PRN Meds: acetaminophen, fentaNYL (SUBLIMAZE) injection, ondansetron (ZOFRAN) IV, mouth rinse, oxyCODONE   Vital Signs    Vitals:   10/09/22 2000 10/09/22 2239 10/10/22 0429 10/10/22 0742  BP: 115/69 119/65 (!) 116/53 (!) 106/57  Pulse: 83 84 Bryan 76  Resp: 14 16 16 15   Temp: 98.4 F (36.9 C) 98.4 F (36.9 C) 99.3 F (37.4 C) 98 F (36.7 C)  TempSrc: Oral Oral Oral Oral  SpO2: 95% 94% 97% 90%  Weight:  78.4 kg    Height:  5\' 7"  (1.702 m)      Intake/Output Summary (Last 24 hours) at 10/10/2022 0755 Last data filed at 10/10/2022 0433 Gross per 24 hour  Intake --  Output 620 ml  Net -620 ml      10/09/2022   10:39 PM 10/09/2022    5:00 AM 10/07/2022    4:00 AM  Last 3 Weights  Weight (lbs) 172 lb 13.5 oz 173 lb 8 oz 185 lb 3 oz  Weight (kg) 78.4 kg 78.7 kg 84 kg      Telemetry    NSR with 1st degree AV block - Personally Reviewed  ECG    NSR with ST elevation and TWI in the anterior leads.  - Personally Reviewed  Physical Exam   GEN: No acute distress.   Neck: No JVD Cardiac: RRR, no murmurs, rubs, or gallops.  Respiratory: Clear to auscultation bilaterally. GI: Soft, nontender, non-distended  MS: No edema; No deformity. Neuro:  Nonfocal  Psych: Normal affect   Labs    High Sensitivity Troponin:   Recent Labs  Lab 10/06/22 2155  TROPONINIHS 43*     Chemistry Recent Labs  Lab 10/07/22 0432 10/08/22 0425 10/09/22 0109  NA 137 136 137  K 3.9 4.1 4.0  CL 106 109 106  CO2 21* 24 21*  GLUCOSE 171*  122* 111*  BUN 22 23 23   CREATININE 1.65* 1.69* 1.38*  CALCIUM 8.2* 8.3* 8.6*  MG 1.9 2.3  --   PROT  --  5.3*  --   ALBUMIN  --  3.0*  --   AST  --  193*  --   ALT  --  53*  --   ALKPHOS  --  46  --   BILITOT  --  0.8  --   GFRNONAA 40* 39* 50*  ANIONGAP 10 3* 10    Lipids  Recent Labs  Lab 10/07/22 0555  CHOL 218*  TRIG 43  HDL 42  LDLCALC 167*  CHOLHDL 5.2    Hematology Recent Labs  Lab 10/08/22 0425 10/09/22 0109 10/10/22 0554  WBC 8.2 7.6 6.5  RBC 4.09* 3.76* 3.63*  HGB 12.3* 11.6* 10.8*  HCT 36.9* 33.1* 31.9*  MCV 90.2 88.0 Bryan.9  MCH 30.1 30.9 29.8  MCHC 33.3 35.0 33.9  RDW 13.8 13.8 13.5  PLT 101* 92* 102*   Thyroid No results for input(s): "TSH", "FREET4" in the last 168  hours.  BNPNo results for input(s): "BNP", "PROBNP" in the last 168 hours.  DDimer No results for input(s): "DDIMER" in the last 168 hours.   Radiology    No results found.  Cardiac Studies   Cath 10/06/2022   Mid RCA lesion is 50% stenosed.   Ost LAD to Prox LAD lesion is 100% stenosed.   Mid LAD lesion is 90% stenosed.   A drug-eluting stent was successfully placed using a STENT ONYX FRONTIER 2.5X26.   Post intervention, there is a 0% residual stenosis.   Post intervention, there is a 0% residual stenosis.   There is mild to moderate left ventricular systolic dysfunction.   LV end diastolic pressure is mildly elevated.   The left ventricular ejection fraction is 35-45% by visual estimate.   1.  Anterior STEMI 2.  Occluded proximal LAD 3.  Mild to moderate reduced left ventricular function with estimated LV ejection fraction 35 to 40% with anterior apical hypokinesis 4.  Cardiogenic shock 5.  Successful primary PCI with DES proximal LAD 6.  Pulsation of blood pressure with intra-aortic balloon pump   Recommendations   Transfer to [facility] for post PCI and intra-aortic balloon pump management   Echo 10/07/2022  1. Left ventricular ejection fraction, by  estimation, is 30 to 35%. The  left ventricle has moderately decreased function. The left ventricle  demonstrates regional wall motion abnormalities (see scoring  diagram/findings for description). There is mild  left ventricular hypertrophy. Left ventricular diastolic parameters are  indeterminate.   2. Right ventricular systolic function was not well visualized. The right  ventricular size is not well visualized. Tricuspid regurgitation signal is  inadequate for assessing PA pressure.   3. Left atrial size was mildly dilated.   4. The mitral valve is normal in structure. No evidence of mitral valve  regurgitation.   5. The aortic valve was not well visualized. Aortic valve regurgitation  is not visualized. No aortic stenosis is present.    Patient Profile     Bryan Garza with PMH of LLE DVT (s/p thrombectomy and PTA 6/21) and mild dementia presented to [facility] with chest pain and found to have anterior STEMI s/p LAD PCI complicated with PEA arrest requiring CPR and brief IABP placement.   Assessment & Plan    Anterior STEMI  - emergent cath revealed 100% prox LAD occlusion treated with DES. Had PEA arrest during procedure requiring CPR. IABP placed and was subsequently weaned off.   - doing well, SBP 106, 1st degree AV block noted, not a lot of BP room, but will add 3.125mg  BID coreg, hopefully will tolerate. D/C foley today. Ambulate with cardiac rehab. Continue triple therapy with ASA, plavix and Eliquis, D/C ASA after 1 month. Likely can be discharged tomorrow if remain stable. PT/OT to see today. Cardiac rehab consulted.   Cardiogenic shock: resolved, BP still borderline making GDMT titration difficult.   PEA arrest during cath: required brief CPR.   Ischemic cardiomyopathy: EF 30-35% with akinesis of anterior, anteroseptal, apical inferior and apex in the setting of anterior MI  Acute on chronic renal insufficiency: in the setting of cardiogenic shock. Cr 1.78 on arrival, Cr from  2 years ago was around 1.1-1.2, now around 1.4, likely near his new baseline.   H/o LLE DVT: on Eliquis at home, Brilinta switched to Plavix due to the need for Eliquis  Dementia  For questions or updates, please contact [place] Please consult [email or web address] for contact info under  Hilbert Corrigan, PA  10/10/2022, 7:55 AM    Patient seen and examined and agree with Almyra Deforest, PA as detailed above.  In brief, the patient is a Bryan Garza with a history of LLE DVT (s/p thrombectomy and PTA; 02/2020) and mild dementia who presented to [facility] with acute onset chest pain and was found to have an anterior STEMI s/p LAD PCI and IABP placement. He was subsequently transferred to [facility] for further management.   Currently, the patient is doing much better and has been transferred out of the ICU. IABP removed on 1/22 which he tolerated well.  TTE with EF 30-35% with anterior, anteroseptal, apical inferior and apex akinesis. GDMT limited due to soft pressures but will add as able. Plan to work with cardiac rehab today and then hopefully home tomorrow.  GEN: Elderly Garza, NAD Neck: No JVD Cardiac: RRR, 1/6 systolic murmur Respiratory: Clear to auscultation bilaterally. GI: Soft, nontender, non-distended  MS: No edema; No deformity. Right access site c/d/i Neuro:  Nonfocal  Psych: Normal affect    Plan: -Continue ASA, plavix and apixaban (on for history of DVT) -Will plan to stop ASA after 1 month -Trial of low dose coreg 3.125mg  BID today -Continue crestor 20mg  daily -Cardiac Rehab -Hopefully discharge tomorrow  Gwyndolyn Kaufman, MD

## 2022-10-10 NOTE — Care Management Important Message (Signed)
Important Message  Patient Details  Name: Nykeem Citro MRN: [id number] Date of Birth: 10/04/1933   Medicare Important Message Given:  Yes     Shelda Altes 10/10/2022, 11:40 AM

## 2022-10-10 NOTE — Progress Notes (Signed)
Pt sound asleep on my arrival. RN not sure of baseline mobility. Encouraged OOB to recliner. Called daughter, Apolonio Schneiders, and discussed. Pt walks in house with rollator at baseline on his good days. Discussed with daughter MI, Plavix importance, stent, signs of fluid, low sodium diet. Apolonio Schneiders voiced understanding. She hopes her dad can come home to be with her, she works from home and has been his caregiver for 2 years. Will place referral for [facility] and discuss more in depth later. Pt has PT ordered.  [place] BS, ACSM-CEP 10/10/2022 1:20 PM

## 2022-10-10 NOTE — Evaluation (Signed)
Physical Therapy Evaluation Patient Details Name: Bryan Garza MRN: [id number] DOB: 1933/10/12 Today's Date: 10/10/2022  History of Present Illness  89 y.o. male presents to [facility] hospital on 10/07/2022 as a transfer from [facility] after having STEMI s/p LAD PCI, PEA arrest during procedure requiring CPR, and IABP placement on 1/20. PMH includes OA, dementia.  Clinical Impression  Pt presents to PT with deficits in functional mobility, gait, endurance, power. Pt mobilizes well on PT eval, performing all mobility with no physical assistance from PT. Pt does appear to fatigue near the end of ambulation, with reduced stride length, however vitals remains stable during session. Pt has 24/7 support from his daughter, who would like to have the pt return home. PT recommends discharge home with HHPT. PT encourages family to provide assistance for all out of bed mobility initially to reduce falls risk as the pt may remain weaker than baseline after such significant medical events leading to and during this admission.     Recommendations for follow up therapy are one component of a multi-disciplinary discharge planning process, led by the attending physician.  Recommendations may be updated based on patient status, additional functional criteria and insurance authorization.  Follow Up Recommendations Home health PT      Assistance Recommended at Discharge Frequent or constant Supervision/Assistance  Patient can return home with the following  A little help with walking and/or transfers;A little help with bathing/dressing/bathroom;Assistance with cooking/housework;Direct supervision/assist for medications management;Help with stairs or ramp for entrance;Assist for transportation;Direct supervision/assist for financial management    Equipment Recommendations None recommended by PT  Recommendations for Other Services       Functional Status Assessment Patient has had a recent decline in their functional  status and demonstrates the ability to make significant improvements in function in a reasonable and predictable amount of time.     Precautions / Restrictions Precautions Precautions: Fall Restrictions Weight Bearing Restrictions: No      Mobility  Bed Mobility Overal bed mobility: Needs Assistance Bed Mobility: Sit to Supine       Sit to supine: Min guard        Transfers Overall transfer level: Needs assistance Equipment used: Rolling walker (2 wheels) Transfers: Sit to/from Stand Sit to Stand: Min guard                Ambulation/Gait Ambulation/Gait assistance: Min guard Gait Distance (Feet): 100 Feet Assistive device: Rolling walker (2 wheels) Gait Pattern/deviations: Step-through pattern, Trunk flexed Gait velocity: reduced Gait velocity interpretation: <1.8 ft/sec, indicate of risk for recurrent falls   General Gait Details: slowed step-through gait, stride length decreases with increased distance and presumed fatigue  Stairs            Wheelchair Mobility    Modified Rankin (Stroke Patients Only)       Balance Overall balance assessment: Needs assistance Sitting-balance support: No upper extremity supported, Feet supported Sitting balance-Leahy Scale: Good     Standing balance support: Single extremity supported, Reliant on assistive device for balance Standing balance-Leahy Scale: Poor                               Pertinent Vitals/Pain Pain Assessment Pain Assessment: Faces Faces Pain Scale: Hurts little more Pain Location: L ribs Pain Descriptors / Indicators: Sore Pain Intervention(s): Monitored during session    Home Living Family/patient expects to be discharged to:: Private residence Living Arrangements: Children Available Help at Discharge:  Family;Available 24 hours/day Type of Home: House Home Access: Stairs to enter Entrance Stairs-Rails: Can reach both Entrance Stairs-Number of Steps: 3   Home  Layout: One level Home Equipment: Rollator (4 wheels);Shower seat      Prior Function Prior Level of Function : Needs assist             Mobility Comments: ambulatory with support of furniture or rollator (daughter has to encourage rollator use) ADLs Comments: assistance with all ADLs     Hand Dominance        Extremity/Trunk Assessment   Upper Extremity Assessment Upper Extremity Assessment: Overall WFL for tasks assessed    Lower Extremity Assessment Lower Extremity Assessment: Generalized weakness    Cervical / Trunk Assessment Cervical / Trunk Assessment: Kyphotic  Communication   Communication: No difficulties  Cognition Arousal/Alertness: Awake/alert Behavior During Therapy: WFL for tasks assessed/performed Overall Cognitive Status: History of cognitive impairments - at baseline                                 General Comments: pt with impaired memory, appears to have baseline dementia. No recall of why he is hospitalized        General Comments General comments (skin integrity, edema, etc.): VSS, mild tachycardia up to 113 observed however pt recovers quickly when resting back to 80s    Exercises     Assessment/Plan    PT Assessment Patient needs continued PT services  PT Problem List Decreased strength;Decreased activity tolerance;Decreased balance;Decreased mobility;Decreased cognition;Decreased safety awareness;Decreased knowledge of use of DME;Decreased knowledge of precautions;Cardiopulmonary status limiting activity       PT Treatment Interventions DME instruction;Gait training;Functional mobility training;Therapeutic activities;Therapeutic exercise;Stair training;Balance training;Neuromuscular re-education;Cognitive remediation;Patient/family education    PT Goals (Current goals can be found in the Care Plan section)  Acute Rehab PT Goals Patient Stated Goal: to return home PT Goal Formulation: With family Time For Goal  Achievement: 10/24/22 Potential to Achieve Goals: Good    Frequency Min 3X/week     Co-evaluation               AM-PAC PT "6 Clicks" Mobility  Outcome Measure Help needed turning from your back to your side while in a flat bed without using bedrails?: A Little Help needed moving from lying on your back to sitting on the side of a flat bed without using bedrails?: A Little Help needed moving to and from a bed to a chair (including a wheelchair)?: A Little Help needed standing up from a chair using your arms (e.g., wheelchair or bedside chair)?: A Little Help needed to walk in hospital room?: A Little Help needed climbing 3-5 steps with a railing? : A Lot 6 Click Score: 17    End of Session   Activity Tolerance: Patient tolerated treatment well Patient left: in bed;with call bell/phone within reach;with bed alarm set Nurse Communication: Mobility status PT Visit Diagnosis: Other abnormalities of gait and mobility (R26.89);Muscle weakness (generalized) (M62.81)    Time: 6967-8938 PT Time Calculation (min) (ACUTE ONLY): 17 min   Charges:   PT Evaluation $PT Eval Moderate Complexity: 1 Mod          Zenaida Niece, PT, DPT Acute Rehabilitation Office [phone]   Zenaida Niece 10/10/2022, 4:44 PM

## 2022-10-10 NOTE — Progress Notes (Signed)
Mobility Specialist - Progress Note   10/10/22 1523  Mobility  Activity Transferred from bed to chair  Level of Assistance +2 (takes two people)  Assistive Device Front wheel walker  Activity Response Tolerated well  $Mobility charge 1 Mobility   Pt was received in bed and agreeable to getting up in chair. Pt was ModA +2 for pivot to chair. Pt was left in chair with all needs met and chair alarm on.   Franki Monte  Mobility Specialist Please contact via Solicitor or Rehab office at [phone]

## 2022-10-11 ENCOUNTER — Other Ambulatory Visit (HOSPITAL_COMMUNITY): Payer: Self-pay

## 2022-10-11 ENCOUNTER — Telehealth: Payer: Self-pay

## 2022-10-11 DIAGNOSIS — I2102 ST elevation (STEMI) myocardial infarction involving left anterior descending coronary artery: Secondary | ICD-10-CM | POA: Diagnosis not present

## 2022-10-11 DIAGNOSIS — I5021 Acute systolic (congestive) heart failure: Secondary | ICD-10-CM | POA: Insufficient documentation

## 2022-10-11 DIAGNOSIS — E785 Hyperlipidemia, unspecified: Secondary | ICD-10-CM | POA: Insufficient documentation

## 2022-10-11 DIAGNOSIS — I2510 Atherosclerotic heart disease of native coronary artery without angina pectoris: Secondary | ICD-10-CM | POA: Insufficient documentation

## 2022-10-11 DIAGNOSIS — R570 Cardiogenic shock: Secondary | ICD-10-CM | POA: Insufficient documentation

## 2022-10-11 LAB — BASIC METABOLIC PANEL
Anion gap: 8 (ref 5–15)
BUN: 33 mg/dL — ABNORMAL HIGH (ref 8–23)
CO2: 22 mmol/L (ref 22–32)
Calcium: 8.3 mg/dL — ABNORMAL LOW (ref 8.9–10.3)
Chloride: 107 mmol/L (ref 98–111)
Creatinine, Ser: 1.44 mg/dL — ABNORMAL HIGH (ref 0.61–1.24)
GFR, Estimated: 47 mL/min — ABNORMAL LOW (ref >60)
Glucose, Bld: 94 mg/dL (ref 70–99)
Potassium: 3.7 mmol/L (ref 3.5–5.1)
Sodium: 137 mmol/L (ref 135–145)

## 2022-10-11 LAB — CBC
HCT: 30 % — ABNORMAL LOW (ref 39.0–52.0)
Hemoglobin: 10.5 g/dL — ABNORMAL LOW (ref 13.0–17.0)
MCH: 30.5 pg (ref 26.0–34.0)
MCHC: 35 g/dL (ref 30.0–36.0)
MCV: 87.2 fL (ref 80.0–100.0)
Platelets: 125 10*3/uL — ABNORMAL LOW (ref 150–400)
RBC: 3.44 MIL/uL — ABNORMAL LOW (ref 4.22–5.81)
RDW: 13.5 % (ref 11.5–15.5)
WBC: 5.9 10*3/uL (ref 4.0–10.5)
nRBC: 0 % (ref 0.0–0.2)

## 2022-10-11 MED ORDER — CLOPIDOGREL BISULFATE 75 MG PO TABS
75.0000 mg | ORAL_TABLET | Freq: Every day | ORAL | 3 refills | Status: AC
  Filled 2022-10-11: qty 90, 90d supply, fill #0

## 2022-10-11 MED ORDER — CARVEDILOL 3.125 MG PO TABS
3.1250 mg | ORAL_TABLET | Freq: Two times a day (BID) | ORAL | 3 refills | Status: AC
  Filled 2022-10-11: qty 180, 90d supply, fill #0

## 2022-10-11 MED ORDER — NITROGLYCERIN 0.4 MG SL SUBL
0.4000 mg | SUBLINGUAL_TABLET | SUBLINGUAL | 3 refills | Status: AC | PRN
  Filled 2022-10-11: qty 25, 7d supply, fill #0

## 2022-10-11 MED ORDER — PANTOPRAZOLE SODIUM 40 MG PO TBEC
40.0000 mg | DELAYED_RELEASE_TABLET | Freq: Every day | ORAL | 3 refills | Status: AC
  Filled 2022-10-11: qty 90, 90d supply, fill #0

## 2022-10-11 MED ORDER — ASPIRIN 81 MG PO TBEC
81.0000 mg | DELAYED_RELEASE_TABLET | Freq: Every day | ORAL | 0 refills | Status: AC
  Filled 2022-10-11: qty 30, 30d supply, fill #0

## 2022-10-11 MED ORDER — ROSUVASTATIN CALCIUM 20 MG PO TABS
20.0000 mg | ORAL_TABLET | Freq: Every day | ORAL | 3 refills | Status: AC
  Filled 2022-10-11: qty 90, 90d supply, fill #0

## 2022-10-11 NOTE — Telephone Encounter (Signed)
**Note De-Identified  Obfuscation** ----- **Note De-Identified  Obfuscation** Message from Ledora Bottcher, [place] sent at 10/11/2022 10:26 AM EST ----- Pt needs a TOC phone call, discharging at noon.  Thanks! Angie

## 2022-10-11 NOTE — Progress Notes (Signed)
TOC medications brought to patient's room.

## 2022-10-11 NOTE — Progress Notes (Signed)
Mobility Specialist - Progress Note   10/11/22 1231  Mobility  Activity Ambulated with assistance in room  Level of Assistance Minimal assist, patient does 75% or more  Assistive Device Front wheel walker  Distance Ambulated (ft) 20 ft  Activity Response Tolerated well  Mobility Referral Yes  $Mobility charge 1 Mobility   Pt received in chair and agreeable. Pt declined hallway ambulation d/t being up multiple times today but was agreeable to room ambulation. No complaints throughout. Pt left in chair with all needs met.   Franki Monte  Mobility Specialist Please contact via Solicitor or Rehab office at [phone]

## 2022-10-11 NOTE — Progress Notes (Signed)
Physical Therapy Treatment Patient Details Name: Bryan Garza MRN: [id number] DOB: 28-Mar-1934 Today's Date: 10/11/2022   History of Present Illness 89 y.o. male presents to [facility] hospital on 10/07/2022 as a transfer from [facility] after having STEMI s/p LAD PCI, PEA arrest during procedure requiring CPR, and IABP placement on 1/20. PMH includes OA, dementia.    PT Comments    Pt supine in bed on entry, with c/o stomach pain. Pt agreeable to get up with therapy and asks to go to bathroom. Pt does not remember stomach pain when asked after sitting in recliner. Pt min guard for bed mobility, transfers and ambulation with RW (cues for safety). Pt able to step up and over step with min A on way from bathroom to recliner. D/c recommendations remain appropriate and return to familiar environment as soon as medically stable is advisable. PT will continue to follow acutely.   Recommendations for follow up therapy are one component of a multi-disciplinary discharge planning process, led by the attending physician.  Recommendations may be updated based on patient status, additional functional criteria and insurance authorization.  Follow Up Recommendations  Home health PT     Assistance Recommended at Discharge Frequent or constant Supervision/Assistance  Patient can return home with the following A little help with walking and/or transfers;A little help with bathing/dressing/bathroom;Assistance with cooking/housework;Direct supervision/assist for medications management;Help with stairs or ramp for entrance;Assist for transportation;Direct supervision/assist for financial management   Equipment Recommendations  None recommended by PT       Precautions / Restrictions Precautions Precautions: Fall Restrictions Weight Bearing Restrictions: No     Mobility  Bed Mobility Overal bed mobility: Needs Assistance Bed Mobility: Sit to Supine       Sit to supine: Min guard   General bed mobility  comments: increased time and effort to come to EoB, however no physical assist required    Transfers Overall transfer level: Needs assistance Equipment used: Rolling walker (2 wheels) Transfers: Sit to/from Stand Sit to Stand: Min guard           General transfer comment: initially puts hands on RW for power up however self corrects to push off from bed surface, good power up and self steady    Ambulation/Gait Ambulation/Gait assistance: Min guard Gait Distance (Feet): 20 Feet (x2) Assistive device: Rolling walker (2 wheels) Gait Pattern/deviations: Step-through pattern, Trunk flexed Gait velocity: reduced Gait velocity interpretation: <1.31 ft/sec, indicative of household ambulator   General Gait Details: slowed step-through gait, exhibits typical posture of pt who utilizes Rollator with increased flexed posture and sharp turning of RW with feet outside [employer] Stairs: Yes Stairs assistance: Min assist Stair Management: Two rails, Forwards (simulated with RW over single step) Number of Stairs: 1 General stair comments: min A at hips for steadying for stepping up and over single step while ambulating from bathroom to recliner       Balance Overall balance assessment: Needs assistance Sitting-balance support: No upper extremity supported, Feet supported Sitting balance-Leahy Scale: Good     Standing balance support: Single extremity supported, Reliant on assistive device for balance Standing balance-Leahy Scale: Poor Standing balance comment: utilizes at least single UE support on sink to wash hands                            Cognition Arousal/Alertness: Awake/alert Behavior During Therapy: WFL for tasks assessed/performed Overall Cognitive Status: History of cognitive impairments - at baseline  General Comments: pt with impaired memory, appears to have baseline dementia. oriented to self, with cuing to  place and situation           General Comments General comments (skin integrity, edema, etc.): VSS, max HR noted with ambulation 110bpm      Pertinent Vitals/Pain Pain Assessment Pain Assessment: Faces Faces Pain Scale: Hurts little more Pain Location: stomach Pain Descriptors / Indicators: Sore Pain Intervention(s): Monitored during session, Repositioned     PT Goals (current goals can now be found in the care plan section) Acute Rehab PT Goals Patient Stated Goal: to return home PT Goal Formulation: With family Time For Goal Achievement: 10/24/22 Potential to Achieve Goals: Good Progress towards PT goals: Progressing toward goals    Frequency    Min 3X/week      PT Plan Current plan remains appropriate       AM-PAC PT "6 Clicks" Mobility   Outcome Measure  Help needed turning from your back to your side while in a flat bed without using bedrails?: A Little Help needed moving from lying on your back to sitting on the side of a flat bed without using bedrails?: A Little Help needed moving to and from a bed to a chair (including a wheelchair)?: A Little Help needed standing up from a chair using your arms (e.g., wheelchair or bedside chair)?: A Little Help needed to walk in hospital room?: A Little Help needed climbing 3-5 steps with a railing? : A Little 6 Click Score: 18    End of Session Equipment Utilized During Treatment: Gait belt Activity Tolerance: Patient tolerated treatment well Patient left: with call bell/phone within reach;in chair;with chair alarm set;Other (comment) (set up for breakfast) Nurse Communication: Mobility status PT Visit Diagnosis: Other abnormalities of gait and mobility (R26.89);Muscle weakness (generalized) (M62.81)     Time: 0071-2197 PT Time Calculation (min) (ACUTE ONLY): 32 min  Charges:  $Gait Training: 8-22 mins $Therapeutic Activity: 8-22 mins                     Saivon Prowse B. Migdalia Dk PT, DPT Acute Rehabilitation  Services Please use secure chat or  Call Office [phone]    [place] 10/11/2022, 9:52 AM

## 2022-10-11 NOTE — Discharge Summary (Addendum)
Discharge Summary    Patient ID: Bryan Garza MRN: [id number]; DOB: 10-05-1933  Admit date: 10/07/2022 Discharge date: 10/11/2022  PCP:  Ebbie Ridge, MD   [facility] Cardiologist:  Freada Bergeron, MD 1}     Discharge Diagnoses    Principal Problem:   STEMI (ST elevation myocardial infarction) [facility]) Active Problems:   DVT (deep venous thrombosis) ([place])   Stage 3a chronic kidney disease ([place])   Acute ST elevation myocardial infarction (STEMI) due to occlusion of left anterior descending (LAD) coronary artery ([place])   Cardiogenic shock ([place])   Acute systolic heart failure ([place])   Hyperlipidemia LDL goal <70   CAD (coronary artery disease)   Diagnostic Studies/Procedures    Echo 10/07/22:  1. Left ventricular ejection fraction, by estimation, is 30 to 35%. The  left ventricle has moderately decreased function. The left ventricle  demonstrates regional wall motion abnormalities (see scoring  diagram/findings for description). There is mild  left ventricular hypertrophy. Left ventricular diastolic parameters are  indeterminate.   2. Right ventricular systolic function was not well visualized. The right  ventricular size is not well visualized. Tricuspid regurgitation signal is  inadequate for assessing PA pressure.   3. Left atrial size was mildly dilated.   4. The mitral valve is normal in structure. No evidence of mitral valve  regurgitation.   5. The aortic valve was not well visualized. Aortic valve regurgitation  is not visualized. No aortic stenosis is present.   _____________  [facility] 10/06/22:   Mid RCA lesion is 50% stenosed.   Ost LAD to Prox LAD lesion is 100% stenosed.   Mid LAD lesion is 90% stenosed.   A drug-eluting stent was successfully placed using a STENT ONYX FRONTIER 2.5X26.   Post intervention, there is a 0% residual stenosis.   Post intervention, there is a 0% residual stenosis.   There is mild to moderate left  ventricular systolic dysfunction.   LV end diastolic pressure is mildly elevated.   The left ventricular ejection fraction is 35-45% by visual estimate.   1.  Anterior STEMI 2.  Occluded proximal LAD 3.  Mild to moderate reduced left ventricular function with estimated LV ejection fraction 35 to 40% with anterior apical hypokinesis 4.  Cardiogenic shock 5.  Successful primary PCI with DES proximal LAD 6.  Pulsation of blood pressure with intra-aortic balloon pump   Recommendations   Transfer to [facility] for post PCI and intra-aortic balloon pump management   History of Present Illness     Bryan Garza is a 89 y.o. male with  with PMH of LLE DVT (s/p thrombectomy and PTA 6/21) and mild dementia presented to [facility] with chest pain and found to have anterior STEMI s/p LAD PCI complicated with PEA arrest during cath requiring CPR and brief IABP placement.  referred for evaluation of new onset chest pain and ECG diagnostic for anterior STEMI.  Patient was usual state of health until earlier this evening when he developed 8 out of 10 substernal chest pain.  Patient presented to [facility] emergency room where initial ECG revealed elevations in leads V2 through V5.  Patient was urgently brought to the cardiac catheterization laboratory where coronary angiography revealed clear the proximal left anterior descending coronary artery.  The patient underwent primary PCI via right radial artery with 2.5 x 26 mm Onyx Frontier drug-eluting stent.  During the procedure, the patient became pulseless requiring brief CPR and was started  on Levophed infusion.  Patient also vomited x 2 and received a grams of Ancef to prevent aspiration pneumonia.  Patient was started on IV cangrelor for platelet inhibition.  Patient remained hypotensive during and after procedure despite Levophed infusion.  Intra-aortic aortic balloon pump was placed via right femoral artery, and was started on heparin infusion.   He was  transferred to [facility] for further management.  Hospital Course     Consultants: none  Anterior STEMI PEA arrest Cardiogenic shock Acute systolic heart failure / Ischemic cardiomyopathy Acute on chronic renal insufficiency Pt presented with chest pain found to have STE in V2-V5 concerning for acute anterior STEMI. He was taken emergently to the cath lab at [facility], which showed 100% proximal LAD occlusion treated with DES-LAD. Unfortunately, pt became pulseless/PEA arrest during cath requiring CPR and IABP placement. IABP weaned and discontinued 10/08/22. Echo with LVEF 30-35% with WMA. He was initially on brilinta but switched to plavix given need for triple therapy x 30 days (eliquis for hx of DVT). Plan to stop ASA after 30 days. GDMT complicated by marginal BP: added 3.125 mc coreg BID. Consider SGLT2i  in the OP setting - both are covered by his insurance, but will need to evaluate movement/sedentary lifestyle after starting PT. Renal function stable - sCr 1.44 at discharge with K 3.7.     Hyperlipidemia with LDL goal < 70 10/07/2022: Cholesterol 218; HDL 42; LDL Cholesterol 167; Triglycerides 43; VLDL 9 - continue crestor 20 mg - recheck labs in 6 weeks, will likely need to go up on crestor +/- [id number]     OA Dementia PT --> HHPT, transition to OP PT   Pt seen and examined by Dr. Johney Frame and deemed stable for discharge. Follow up appt has been arranged.      Did the patient have an acute coronary syndrome (MI, NSTEMI, STEMI, etc) this admission?:  Yes                               AHA/ACC Clinical Performance & Quality Measures: Aspirin prescribed? - Yes ADP Receptor Inhibitor (Plavix/Clopidogrel, Brilinta/Ticagrelor or Effient/Prasugrel) prescribed (includes medically managed patients)? - Yes Beta Blocker prescribed? - Yes High Intensity Statin (Lipitor 40-80mg  or Crestor 20-40mg ) prescribed? - Yes EF assessed during THIS hospitalization? - Yes For EF <40%, was ACEI/ARB  prescribed? - No - Reason:  soft BP For EF <40%, Aldosterone Antagonist (Spironolactone or Eplerenone) prescribed? - No - Reason:  soft BP Cardiac Rehab Phase II ordered (including medically managed patients)? - Yes       The patient will be scheduled for a TOC follow up appointment in 7-14 days.  A message has been sent to the [facility] and Scheduling Pool at the office where the patient should be seen for follow up.  _____________  Discharge Vitals Blood pressure 111/60, pulse 83, temperature 98.9 F (37.2 C), temperature source Oral, resp. rate 18, height 5\' 7"  (1.702 m), weight 78.4 kg, SpO2 95 %.  Filed Weights   10/07/22 0400 10/09/22 0500 10/09/22 2239  Weight: 84 kg 78.7 kg 78.4 kg    Labs & Radiologic Studies    CBC Recent Labs    10/10/22 0554 10/11/22 0354  WBC 6.5 5.9  HGB 10.8* 10.5*  HCT 31.9* 30.0*  MCV 87.9 87.2  PLT 102* 696*   Basic Metabolic Panel Recent Labs    10/10/22 0554 10/11/22 0354  NA 138 137  K 4.2  3.7  CL 110 107  CO2 20* 22  GLUCOSE 102* 94  BUN 26* 33*  CREATININE 1.40* 1.44*  CALCIUM 8.3* 8.3*   Liver Function Tests No results for input(s): "AST", "ALT", "ALKPHOS", "BILITOT", "PROT", "ALBUMIN" in the last 72 hours. No results for input(s): "LIPASE", "AMYLASE" in the last 72 hours. High Sensitivity Troponin:   Recent Labs  Lab 10/06/22 2155  TROPONINIHS 43*    BNP Invalid input(s): "POCBNP" D-Dimer No results for input(s): "DDIMER" in the last 72 hours. Hemoglobin A1C No results for input(s): "HGBA1C" in the last 72 hours. Fasting Lipid Panel No results for input(s): "CHOL", "HDL", "LDLCALC", "TRIG", "CHOLHDL", "LDLDIRECT" in the last 72 hours. Thyroid Function Tests No results for input(s): "TSH", "T4TOTAL", "T3FREE", "THYROIDAB" in the last 72 hours.  Invalid input(s): "FREET3" _____________  DG Chest Port 1 View  Result Date: 10/08/2022 CLINICAL DATA:  [id number] On intra-aortic balloon pump assist [id number] EXAM:  PORTABLE CHEST 1 VIEW COMPARISON:  October 07, 2022 FINDINGS: The cardiomediastinal silhouette is unchanged in contour.IABP metallic marker projects at the approximate level of T7 over the descending thoracic aorta. No pleural effusion. No pneumothorax. Similar mild interstitial prominence and mild peribronchial cuffing. IMPRESSION: IABP metallic marker projects at the approximate level of T7 over the descending thoracic aorta. Electronically Signed   By: Meda Klinefelter M.D.   On: 10/08/2022 07:45   ECHOCARDIOGRAM COMPLETE  Result Date: 10/07/2022    ECHOCARDIOGRAM REPORT   Patient Name:   Bryan Garza Date of Exam: 10/07/2022 Medical Rec #:  [id number]          Height:       67.0 in Accession #:    [id number]         Weight:       185.2 lb Date of Birth:  1933-11-16         BSA:          1.957 m Patient Age:    88 years           BP:           125/66 mmHg Patient Gender: M                  HR:           88 bpm. Exam Location:  Inpatient Procedure: 2D Echo, Cardiac Doppler, Color Doppler and Intracardiac            Opacification Agent Indications:    Acute myocardial infarction  History:        Patient has no prior history of Echocardiogram examinations.                 CKD. Hx DVT. IABP.  Sonographer:    Ross Ludwig RDCS (AE) Referring Phys: Ralene Ok  Sonographer Comments: Technically difficult study due to poor echo windows. Patient sensitve to probe pressure. IMPRESSIONS  1. Left ventricular ejection fraction, by estimation, is 30 to 35%. The left ventricle has moderately decreased function. The left ventricle demonstrates regional wall motion abnormalities (see scoring diagram/findings for description). There is mild left ventricular hypertrophy. Left ventricular diastolic parameters are indeterminate.  2. Right ventricular systolic function was not well visualized. The right ventricular size is not well visualized. Tricuspid regurgitation signal is inadequate for assessing PA pressure.  3. Left  atrial size was mildly dilated.  4. The mitral valve is normal in structure. No evidence of mitral valve regurgitation.  5. The aortic valve was not well visualized.  Aortic valve regurgitation is not visualized. No aortic stenosis is present. FINDINGS  Left Ventricle: Left ventricular ejection fraction, by estimation, is 30 to 35%. The left ventricle has moderately decreased function. The left ventricle demonstrates regional wall motion abnormalities. Definity contrast agent was given IV to delineate the left ventricular endocardial borders. The left ventricular internal cavity size was normal in size. There is mild left ventricular hypertrophy. Left ventricular diastolic parameters are indeterminate.  LV Wall Scoring: The mid and distal anterior wall, mid and distal anterior septum, apical inferior segment, and apex are akinetic. The entire lateral wall, inferior wall, basal anteroseptal segment, mid inferoseptal segment, basal anterior segment, and basal inferoseptal segment are normal. Right Ventricle: The right ventricular size is not well visualized. Right vetricular wall thickness was not well visualized. Right ventricular systolic function was not well visualized. Tricuspid regurgitation signal is inadequate for assessing PA pressure. Left Atrium: Left atrial size was mildly dilated. Right Atrium: Right atrial size was normal in size. Pericardium: There is no evidence of pericardial effusion. Mitral Valve: The mitral valve is normal in structure. No evidence of mitral valve regurgitation. Tricuspid Valve: The tricuspid valve is normal in structure. Tricuspid valve regurgitation is trivial. Aortic Valve: The aortic valve was not well visualized. Aortic valve regurgitation is not visualized. No aortic stenosis is present. Aortic valve mean gradient measures 3.0 mmHg. Aortic valve peak gradient measures 5.2 mmHg. Aortic valve area, by VTI measures 1.94 cm. Pulmonic Valve: The pulmonic valve was not well  visualized. Pulmonic valve regurgitation is not visualized. Aorta: The aortic root and ascending aorta are structurally normal, with no evidence of dilitation. IAS/Shunts: The interatrial septum was not well visualized.  LEFT VENTRICLE PLAX 2D LVIDd:         5.00 cm   Diastology LVIDs:         4.10 cm   LV e' medial:    4.57 cm/s LV PW:         1.10 cm   LV E/e' medial:  16.7 LV IVS:        1.10 cm   LV e' lateral:   6.96 cm/s LVOT diam:     2.00 cm   LV E/e' lateral: 10.9 LV SV:         39 LV SV Index:   20 LVOT Area:     3.14 cm  RIGHT VENTRICLE RV Basal diam:  2.50 cm RV S prime:     10.10 cm/s TAPSE (M-mode): 1.0 cm LEFT ATRIUM             Index        RIGHT ATRIUM          Index LA diam:        3.30 cm 1.69 cm/m   RA Area:     8.59 cm LA Vol (A2C):   57.6 ml 29.43 ml/m  RA Volume:   14.60 ml 7.46 ml/m LA Vol (A4C):   74.6 ml 38.12 ml/m LA Biplane Vol: 67.5 ml 34.49 ml/m  AORTIC VALVE AV Area (Vmax):    1.97 cm AV Area (Vmean):   1.88 cm AV Area (VTI):     1.94 cm AV Vmax:           114.00 cm/s AV Vmean:          75.500 cm/s AV VTI:            0.202 m AV Peak Grad:      5.2 mmHg AV Mean  Grad:      3.0 mmHg LVOT Vmax:         71.60 cm/s LVOT Vmean:        45.100 cm/s LVOT VTI:          0.125 m LVOT/AV VTI ratio: 0.62  AORTA Ao Root diam: 3.20 cm Ao Asc diam:  3.40 cm MITRAL VALVE MV Area (PHT): 3.61 cm    SHUNTS MV Decel Time: 210 msec    Systemic VTI:  0.12 m MV E velocity: 76.20 cm/s  Systemic Diam: 2.00 cm Oswaldo Milian MD Electronically signed by Oswaldo Milian MD Signature Date/Time: 10/07/2022/3:29:11 PM    Final    DG CHEST PORT 1 VIEW  Result Date: 10/07/2022 CLINICAL DATA:  Follow-up intra-aortic balloon pump. EXAM: PORTABLE CHEST 1 VIEW COMPARISON:  10/06/2022 FINDINGS: Intra-aortic balloon pump marker is identified along the inferior aspect of the aortic arch between the posterior sixth and seventh rib spaces. Normal heart size. No pleural fluid noted. Increased peripheral  septal lines identified within the lower lung zones. No airspace opacities. IMPRESSION: 1. Intra-aortic balloon pump marker is along the inferior aspect of the aortic arch. 2. Suspect mild interstitial edema. Electronically Signed   By: Kerby Moors M.D.   On: 10/07/2022 07:53   CARDIAC CATHETERIZATION  Result Date: 10/07/2022   Mid RCA lesion is 50% stenosed.   Ost LAD to Prox LAD lesion is 100% stenosed.   Mid LAD lesion is 90% stenosed.   A drug-eluting stent was successfully placed using a STENT ONYX FRONTIER 2.5X26.   Post intervention, there is a 0% residual stenosis.   Post intervention, there is a 0% residual stenosis.   There is mild to moderate left ventricular systolic dysfunction.   LV end diastolic pressure is mildly elevated.   The left ventricular ejection fraction is 35-45% by visual estimate. 1.  Anterior STEMI 2.  Occluded proximal LAD 3.  Mild to moderate reduced left ventricular function with estimated LV ejection fraction 35 to 40% with anterior apical hypokinesis 4.  Cardiogenic shock 5.  Successful primary PCI with DES proximal LAD 6.  Pulsation of blood pressure with intra-aortic balloon pump Recommendations Transfer to [facility] for post PCI and intra-aortic balloon pump management   DG Chest 2 View  Result Date: 10/06/2022 CLINICAL DATA:  Chest pain. EXAM: CHEST - 2 VIEW COMPARISON:  March 15, 2020 FINDINGS: The heart size and mediastinal contours are within normal limits. Mild, diffuse, chronic appearing increased lung markings are seen. Very mild atelectasis is noted within the lateral aspects of the bilateral lung bases. There is no evidence of an acute infiltrate, pleural effusion or pneumothorax. The visualized skeletal structures are unremarkable. IMPRESSION: Chronic appearing increased lung markings without acute cardiopulmonary disease. Electronically Signed   By: Virgina Norfolk M.D.   On: 10/06/2022 22:05   Disposition   Pt is being discharged home today in  good condition.  Follow-up Plans & Appointments     Follow-up Information     [facility] Follow up.   Specialty: Rehabilitation Why: Office to call with visit times-if the office has not called within 3-5 business days, please call the office. Contact information: [place] [id number] [address] [place] [phone]        Richardson Dopp T, PA-C Follow up on 10/17/2022.   Specialties: Cardiology, Physician Assistant Why: 2:45 pm for hospital follow up Contact information: [address] [place] [address]  [phone]                Discharge Instructions     Amb Referral to Cardiac Rehabilitation   Complete by: As directed    Diagnosis:  Coronary Stents STEMI PTCA     After initial evaluation and assessments completed: Virtual Based Care may be provided alone or in conjunction with Phase 2 Cardiac Rehab based on patient barriers.: Yes   Intensive Cardiac Rehabilitation (ICR) [facility] location only OR Traditional Cardiac Rehabilitation (TCR) *If criteria for ICR are not met will enroll in TCR [facility] only): Yes   Ambulatory referral to Physical Therapy   Complete by: As directed    Physical Therapy-outpatient evaluation and treatment.   Diet - low sodium heart healthy   Complete by: As directed    Discharge instructions   Complete by: As directed    No driving. No lifting over 5 lbs for 1 week. No sexual activity for 1 week. Keep procedure site clean & dry. If you notice increased pain, swelling, bleeding or pus, call/return!  You may shower, but no soaking baths/hot tubs/pools for 1 week.   Discharge wound care:   Complete by: As directed    Keep clean and dry, no baths. Shower only with antibacterial soap   Increase activity slowly   Complete by: As directed         Discharge Medications   Allergies as of 10/11/2022       Reactions   Tetracyclines & Related Nausea  And Vomiting   Flu symptoms        Medication List     STOP taking these medications    omeprazole 40 MG capsule Commonly known as: PRILOSEC       TAKE these medications    aspirin EC 81 MG tablet Take 1 tablet (81 mg total) by mouth daily. Swallow whole. Stop after 30 days.   carvedilol 3.125 MG tablet Commonly known as: COREG Take 1 tablet (3.125 mg total) by mouth 2 (two) times daily with a meal.   clopidogrel 75 MG tablet Commonly known as: PLAVIX Take 1 tablet (75 mg total) by mouth daily.   donepezil 10 MG tablet Commonly known as: ARICEPT Take 10 mg by mouth at bedtime.   Eliquis 2.5 MG Tabs tablet Generic drug: apixaban TAKE 1 TABLET BY MOUTH TWICE DAILY   famotidine 40 MG tablet Commonly known as: PEPCID Take 40 mg by mouth at bedtime.   finasteride 5 MG tablet Commonly known as: PROSCAR Take 5 mg by mouth daily.   memantine 5 MG tablet Commonly known as: NAMENDA Take 5 mg by mouth 2 (two) times daily.   multivitamin with minerals Tabs tablet Take 1 tablet by mouth daily.   nitroGLYCERIN 0.4 MG SL tablet Commonly known as: Nitrostat Place 1 tablet (0.4 mg total) under the tongue every 5 (five) minutes as needed for chest pain.   pantoprazole 40 MG tablet Commonly known as: PROTONIX Take 1 tablet (40 mg total) by mouth daily.   QUEtiapine 50 MG tablet Commonly known as: SEROQUEL Take 50 mg by mouth at bedtime.   rosuvastatin 20 MG tablet Commonly known as: CRESTOR Take 1 tablet (20 mg total) by mouth at bedtime.   tamsulosin 0.4 MG Caps capsule Commonly known as: FLOMAX Take 0.8 mg by mouth daily.               Discharge Care Instructions  (From admission, onward)  Start     Ordered   10/11/22 0000  Discharge wound care:       Comments: Keep clean and dry, no baths. Shower only with antibacterial soap   10/11/22 1016               Outstanding Labs/Studies   Titrating GDMT  Duration of Discharge  Encounter   Greater than 30 minutes including physician time.  Signed, Ledora Bottcher, PA 10/11/2022, 10:35 AM   Patient seen and examined and agree with Fabian Sharp, PA as detailed above. Please see progress note from today for full plan.  Gwyndolyn Kaufman, MD

## 2022-10-11 NOTE — Progress Notes (Addendum)
Rounding Note    Patient Name: Bryan Garza Date of Encounter: 10/11/2022  [place] Cardiologist: Freada Bergeron, MD   Subjective   Pt found resting flat on his side, complains of GI upset but can't give details (nausea vs diarrhea/constipation). Will need to check back. Has not received coreg yet this morning  Inpatient Medications    Scheduled Meds:  apixaban  2.5 mg Oral BID   aspirin EC  81 mg Oral Daily   carvedilol  3.125 mg Oral BID WC   clopidogrel  75 mg Oral Daily   donepezil  10 mg Oral QHS   famotidine  40 mg Oral QHS   finasteride  5 mg Oral Daily   pantoprazole  40 mg Oral Daily   QUEtiapine  50 mg Oral QHS   rosuvastatin  20 mg Oral QHS   tamsulosin  0.4 mg Oral QPC breakfast   Thrombi-Pad  1 each Topical Once   Continuous Infusions:  PRN Meds: acetaminophen, fentaNYL (SUBLIMAZE) injection, ondansetron (ZOFRAN) IV, mouth rinse, oxyCODONE   Vital Signs    Vitals:   10/10/22 1611 10/10/22 1710 10/10/22 2058 10/11/22 0500  BP: 104/73 113/67 (!) 93/49 (!) 95/48  Pulse: 88  90 71  Resp: 17  18 18   Temp: 98.3 F (36.8 C)  99 F (37.2 C) 98.9 F (37.2 C)  TempSrc: Oral  Oral Oral  SpO2: 95%  96% 95%  Weight:      Height:        Intake/Output Summary (Last 24 hours) at 10/11/2022 0758 Last data filed at 10/10/2022 1900 Gross per 24 hour  Intake --  Output 330 ml  Net -330 ml      10/09/2022   10:39 PM 10/09/2022    5:00 AM 10/07/2022    4:00 AM  Last 3 Weights  Weight (lbs) 172 lb 13.5 oz 173 lb 8 oz 185 lb 3 oz  Weight (kg) 78.4 kg 78.7 kg 84 kg      Telemetry    Sinus rhythm with HR 70-80s with PVCs, PACs - Personally Reviewed  ECG    No new tracings - Personally Reviewed  Physical Exam   GEN: No acute distress.   Neck: No JVD Cardiac: RRR, no murmurs, rubs, or gallops.  Respiratory: Clear to auscultation bilaterally. GI: Soft, nontender, non-distended  MS: No edema; No deformity. Neuro:  Nonfocal   Psych: Normal affect  Right radial site C/D/I  Labs    High Sensitivity Troponin:   Recent Labs  Lab 10/06/22 2155  TROPONINIHS 43*     Chemistry Recent Labs  Lab 10/07/22 0432 10/08/22 0425 10/09/22 0109 10/10/22 0554 10/11/22 0354  NA 137 136 137 138 137  K 3.9 4.1 4.0 4.2 3.7  CL 106 109 106 110 107  CO2 21* 24 21* 20* 22  GLUCOSE 171* 122* 111* 102* 94  BUN 22 23 23  26* 33*  CREATININE 1.65* 1.69* 1.38* 1.40* 1.44*  CALCIUM 8.2* 8.3* 8.6* 8.3* 8.3*  MG 1.9 2.3  --   --   --   PROT  --  5.3*  --   --   --   ALBUMIN  --  3.0*  --   --   --   AST  --  193*  --   --   --   ALT  --  53*  --   --   --   ALKPHOS  --  46  --   --   --  BILITOT  --  0.8  --   --   --   GFRNONAA 40* 39* 50* 49* 47*  ANIONGAP 10 3* 10 8 8     Lipids  Recent Labs  Lab 10/07/22 0555  CHOL 218*  TRIG 43  HDL 42  LDLCALC 167*  CHOLHDL 5.2    Hematology Recent Labs  Lab 10/09/22 0109 10/10/22 0554 10/11/22 0354  WBC 7.6 6.5 5.9  RBC 3.76* 3.63* 3.44*  HGB 11.6* 10.8* 10.5*  HCT 33.1* 31.9* 30.0*  MCV 88.0 87.9 87.2  MCH 30.9 29.8 30.5  MCHC 35.0 33.9 35.0  RDW 13.8 13.5 13.5  PLT 92* 102* 125*   Thyroid No results for input(s): "TSH", "FREET4" in the last 168 hours.  BNPNo results for input(s): "BNP", "PROBNP" in the last 168 hours.  DDimer No results for input(s): "DDIMER" in the last 168 hours.   Radiology    No results found.  Cardiac Studies   Cath 10/06/2022   Mid RCA lesion is 50% stenosed.   Ost LAD to Prox LAD lesion is 100% stenosed.   Mid LAD lesion is 90% stenosed.   A drug-eluting stent was successfully placed using a STENT ONYX FRONTIER 2.5X26.   Post intervention, there is a 0% residual stenosis.   Post intervention, there is a 0% residual stenosis.   There is mild to moderate left ventricular systolic dysfunction.   LV end diastolic pressure is mildly elevated.   The left ventricular ejection fraction is 35-45% by visual estimate.   1.  Anterior  STEMI 2.  Occluded proximal LAD 3.  Mild to moderate reduced left ventricular function with estimated LV ejection fraction 35 to 40% with anterior apical hypokinesis 4.  Cardiogenic shock 5.  Successful primary PCI with DES proximal LAD 6.  Pulsation of blood pressure with intra-aortic balloon pump   Recommendations   Transfer to [facility] for post PCI and intra-aortic balloon pump management     Echo 10/07/2022  1. Left ventricular ejection fraction, by estimation, is 30 to 35%. The  left ventricle has moderately decreased function. The left ventricle  demonstrates regional wall motion abnormalities (see scoring  diagram/findings for description). There is mild  left ventricular hypertrophy. Left ventricular diastolic parameters are  indeterminate.   2. Right ventricular systolic function was not well visualized. The right  ventricular size is not well visualized. Tricuspid regurgitation signal is  inadequate for assessing PA pressure.   3. Left atrial size was mildly dilated.   4. The mitral valve is normal in structure. No evidence of mitral valve  regurgitation.   5. The aortic valve was not well visualized. Aortic valve regurgitation  is not visualized. No aortic stenosis is present.   Patient Profile     89 y.o. male with PMH of LLE DVT (s/p thrombectomy and PTA 6/21) and mild dementia presented to [facility] with chest pain and found to have anterior STEMI s/p LAD PCI complicated with PEA arrest during cath requiring CPR and brief IABP placement   Assessment & Plan    Anterior STEMI PEA arrest Cardiogenic shock Acute systolic heart failure / Ischemic cardiomyopathy Acute on chronic renal insufficiency - 100% proximal LAD occlusion treated with DES - PEA arrest requiring CPR and IABP placement - IABP weaned and discontinued 10/08/22 - echo with LVEF 30-35% with WMA - triple therapy x 30 days (eliquis for hx of DVT), then stop ASA after 30 days - GDMT complicated by  marginal BP: added 3.125 mc  coreg BID yesterday - hasn't received yet, may not tolerate, consider switching to low dose toprol - follow BP once up and moving around - needs to walk today - consider SGLT2i - both are covered on his insurance - renal function stable   Hyperlipidemia with LDL goal < 70 10/07/2022: Cholesterol 218; HDL 42; LDL Cholesterol 167; Triglycerides 43; VLDL 9 - continue crestor 20 mg   GI upset Can't give details  Will check back   OA Dementia PT --> HHPT  For questions or updates, please contact [place] Please consult [email or web address] for contact info under        Signed, Ledora Bottcher, PA  10/11/2022, 7:58 AM    Patient seen and examined and agree with Almyra Deforest, PA as detailed above.   In brief, the patient is a 89 y.o. male with a history of LLE DVT (s/p thrombectomy and PTA; 02/2020) and mild dementia who presented to [facility] with acute onset chest pain and was found to have an anterior STEMI s/p LAD PCI and IABP placement. He was subsequently transferred to [facility] for further management.    IABP was removed on 1/22 which he tolerated well.  TTE with EF 30-35% with anterior, anteroseptal, apical inferior and apex akinesis. GDMT limited due to soft pressures but seems to be tolerating low dose coreg.  Has been seen by PT and was recommended for home PT on discharge.   Patient feels well today. Will plan for discharge this morning. This was discussed with the daughter and she will be here by noon to pick him up and go over discharge instructions.   GEN: Elderly male, NAD Neck: No JVD Cardiac: RRR, 1/6 systolic murmur Respiratory: Clear to auscultation bilaterally. GI: Soft, nontender, non-distended  MS: No edema; No deformity. Right access site c/d/i Neuro:  Nonfocal  Psych: Normal affect     Plan: -Continue ASA, plavix and apixaban (on for history of DVT) -Will plan to stop ASA after 1 month -Continue low dose coreg  3.125mg  BID  -Continue crestor 20mg  daily -Cardiac rehab  Plan to discharge home today. Discussed with patient's daughter.    Gwyndolyn Kaufman, MD

## 2022-10-11 NOTE — Evaluation (Signed)
Occupational Therapy Evaluation/Discharge Patient Details Name: Bryan Garza MRN: [id number] DOB: [birth year] Today's Date: 10/11/2022   History of Present Illness 89 y.o. male presents to [facility] hospital on 10/07/2022 as a transfer from [facility] after having STEMI s/p LAD PCI, PEA arrest during procedure requiring CPR, and IABP placement on 1/20. PMH includes OA, dementia.   Clinical Impression   PTA, pt lives with daughter who can provide 24/7 support at home. Per chart, pt ambulatory using Rollator vs furniture walking and requires some assist for ADLs likely due to cognition. Pt presents now fairly close to baseline for ADLs/mobility. Pt demonstrating good endurance for ADLs standing at sink > 7 min and able to automatically sequence familiar ADL tasks fairly well. As pt close to baseline and has assist as needed at home, no further skilled OT services indicated at this time. Noted plan for DC home today. Please reconsult if needs change.      Recommendations for follow up therapy are one component of a multi-disciplinary discharge planning process, led by the attending physician.  Recommendations may be updated based on patient status, additional functional criteria and insurance authorization.   Follow Up Recommendations  No OT follow up     Assistance Recommended at Discharge Frequent or constant Supervision/Assistance  Patient can return home with the following A little help with walking and/or transfers;A little help with bathing/dressing/bathroom;Assistance with cooking/housework;Direct supervision/assist for medications management;Direct supervision/assist for financial management    Functional Status Assessment  Patient has not had a recent decline in their functional status  Equipment Recommendations  None recommended by OT    Recommendations for Other Services       Precautions / Restrictions Precautions Precautions: Fall;Other (comment) Precaution Comments: monitor  HR Restrictions Weight Bearing Restrictions: No      Mobility Bed Mobility Overal bed mobility: Needs Assistance Bed Mobility: Sit to Supine       Sit to supine: Min assist   General bed mobility comments: assist to get BLE into bed    Transfers Overall transfer level: Needs assistance Equipment used: Rolling walker (2 wheels) Transfers: Sit to/from Stand Sit to Stand: Min guard           General transfer comment: increased time to rise pushing from chair to stand though able to do so without assist      Balance Overall balance assessment: Needs assistance Sitting-balance support: No upper extremity supported, Feet supported Sitting balance-Leahy Scale: Good     Standing balance support: Single extremity supported, Reliant on assistive device for balance Standing balance-Leahy Scale: Fair Standing balance comment: fair static standing at sink, BUE for mobility                           ADL either performed or assessed with clinical judgement   ADL Overall ADL's : At baseline;Needs assistance/impaired     Grooming: Supervision/safety;Standing;Oral care;Wash/dry face Grooming Details (indicate cue type and reason): able to automatically sequence brushing teeth easily standing at sink > 7 min for task once items placed on countertop Upper Body Bathing: Supervision/ safety   Lower Body Bathing: Minimal assistance   Upper Body Dressing : Supervision/safety   Lower Body Dressing: Minimal assistance   Toilet Transfer: Min guard;Supervision/safety;Ambulation;Rolling walker (2 wheels)   Toileting- Clothing Manipulation and Hygiene: Minimal assistance;Sitting/lateral lean;Sit to/from stand       Functional mobility during ADLs: Supervision/safety;Min guard;Rolling walker (2 wheels) General ADL Comments: appears close to baseline, likely  needs assist for LB ADLs d/t minor balance impairments and cognition. Endurance [facility] for daily tasks/routines      Vision Baseline Vision/History: 1 Wears glasses Ability to See in Adequate Light: 0 Adequate Patient Visual Report: No change from baseline Vision Assessment?: No apparent visual deficits     Perception     Praxis      Pertinent Vitals/Pain Pain Assessment Pain Assessment: Faces Faces Pain Scale: No hurt     Hand Dominance Right   Extremity/Trunk Assessment Upper Extremity Assessment Upper Extremity Assessment: Overall WFL for tasks assessed   Lower Extremity Assessment Lower Extremity Assessment: Defer to PT evaluation   Cervical / Trunk Assessment Cervical / Trunk Assessment: Kyphotic   Communication Communication Communication: No difficulties   Cognition Arousal/Alertness: Awake/alert Behavior During Therapy: WFL for tasks assessed/performed Overall Cognitive Status: History of cognitive impairments - at baseline                                 General Comments: pt with impaired memory, appears to have baseline dementia. oriented to self, with cuing to place and situation. able to automatically sequence familiar ADL tasks without issue. difficulty expressing needs/directing care at times     General Comments  HR 103bpm w/ ADLs    Exercises     Shoulder Instructions      Home Living Family/patient expects to be discharged to:: Private residence Living Arrangements: Children Available Help at Discharge: Family;Available 24 hours/day Type of Home: House Home Access: Stairs to enter [employer] of Steps: 3 Entrance Stairs-Rails: Can reach both Home Layout: One level               Home Equipment: Rollator (4 wheels);Shower seat          Prior Functioning/Environment Prior Level of Function : Needs assist             Mobility Comments: ambulatory with support of furniture or rollator (daughter has to encourage rollator use) ADLs Comments: assistance with all ADLs d/t cognitive status per PT eval        OT  Problem List: Decreased cognition;Impaired balance (sitting and/or standing)      OT Treatment/Interventions:      OT Goals(Current goals can be found in the care plan section) Acute Rehab OT Goals Patient Stated Goal: get back in bed and take a nap OT Goal Formulation: All assessment and education complete, DC therapy  OT Frequency:      Co-evaluation              AM-PAC OT "6 Clicks" Daily Activity     Outcome Measure Help from another person eating meals?: None Help from another person taking care of personal grooming?: A Little Help from another person toileting, which includes using toliet, bedpan, or urinal?: A Little Help from another person bathing (including washing, rinsing, drying)?: A Little Help from another person to put on and taking off regular upper body clothing?: A Little Help from another person to put on and taking off regular lower body clothing?: A Little 6 Click Score: 19   End of Session Equipment Utilized During Treatment: Rolling walker (2 wheels)  Activity Tolerance: Patient tolerated treatment well Patient left: in bed;with bed alarm set;with call bell/phone within reach  OT Visit Diagnosis: Other symptoms and signs involving cognitive function                Time: 1101-1118 OT  Time Calculation (min): 17 min Charges:  OT General Charges $OT Visit: 1 Visit OT Evaluation $OT Eval Low Complexity: 1 Low  Bradd Canary, OTR/L Acute Rehab Services Office: [phone]   Lorre Munroe 10/11/2022, 11:28 AM

## 2022-10-11 NOTE — Care Management (Signed)
  Transition of Care [facility]) Screening Note   Patient Details  Name: Bryan Garza Date of Birth: November 13, 1933   Transition of Care [facility]) CM/SW Contact:    Bethena Roys, RN Phone Number: 10/11/2022, 10:17 AM    Transition of Care Department [facility]) has reviewed the patient. Physical Therapy recommendations are for [facility] PT. Case Manager spoke with daughter Apolonio Schneiders and patient lives with her. Plan will be for outpatient physical therapy. Daughter wants the referral submitted to [facility] for outpatient therapy. Ambulatory referral submitted and the office will call for visit times. Case Manager discussed durable medical equipment for the patient and he has rollator. Shower chair, and pulse oximetry. Daughter to order DME bedside commode. Daughter will provide transportation home via private vehicle. Meds will be delivered via [facility]. No further needs identified.

## 2022-10-12 NOTE — Telephone Encounter (Signed)
**Note De-Identified  Obfuscation** Transition Care Management Unsuccessful Follow-up Telephone Call  Date of discharge and from where:  10/12/1933 from [facility].  Attempts:  1st Attempt  Reason for unsuccessful TCM follow-up call:  A lady who identified herself as the pts daughter, Rachael answered the phone. She declined to let me s/w the pt even to have him give me permission to speak with her on his behalf as we have no DPR on file for the pt. She must have mistakenly thought that I was calling from Dr Antony Odea office as she stated they were already aware of his f/u with him on Monday. When I advised her that I am calling from [facility] she stated, you do not need to s/w him then because he will not be keeping that appointment. She states that they plan to speak with Dr Ola Spurr about finding a Jefm Bryant cardiologist closer to [place] as our office is to far away from him. She thanked me and ended the call.  Will forward this message to Richardson Dopp, PA-c and Dr Johney Frame as Juluis Rainier.

## 2022-10-14 NOTE — Telephone Encounter (Signed)
Ok. Thanks for letting us know. Anderson Malta can you make sure his appt is canceled? Thanks Richardson Dopp, PA-C    10/14/2022 7:34 AM

## 2022-10-15 DIAGNOSIS — R351 Nocturia: Secondary | ICD-10-CM | POA: Diagnosis not present

## 2022-10-15 DIAGNOSIS — I8290 Acute embolism and thrombosis of unspecified vein: Secondary | ICD-10-CM | POA: Diagnosis not present

## 2022-10-15 DIAGNOSIS — I2510 Atherosclerotic heart disease of native coronary artery without angina pectoris: Secondary | ICD-10-CM | POA: Diagnosis not present

## 2022-10-15 DIAGNOSIS — I2583 Coronary atherosclerosis due to lipid rich plaque: Secondary | ICD-10-CM | POA: Diagnosis not present

## 2022-10-15 DIAGNOSIS — Z7901 Long term (current) use of anticoagulants: Secondary | ICD-10-CM | POA: Diagnosis not present

## 2022-10-15 DIAGNOSIS — N401 Enlarged prostate with lower urinary tract symptoms: Secondary | ICD-10-CM | POA: Diagnosis not present

## 2022-10-15 DIAGNOSIS — F0390 Unspecified dementia without behavioral disturbance: Secondary | ICD-10-CM | POA: Diagnosis not present

## 2022-10-15 DIAGNOSIS — I213 ST elevation (STEMI) myocardial infarction of unspecified site: Secondary | ICD-10-CM | POA: Diagnosis not present

## 2022-10-15 DIAGNOSIS — N1831 Chronic kidney disease, stage 3a: Secondary | ICD-10-CM | POA: Diagnosis not present

## 2022-10-15 NOTE — Telephone Encounter (Signed)
Per phone note, pt's appointment with Richardson Dopp, PA-C, 10/17/22 has been cancelled.

## 2022-10-17 ENCOUNTER — Ambulatory Visit: Payer: PPO | Admitting: Physician Assistant

## 2022-10-22 DIAGNOSIS — I2102 ST elevation (STEMI) myocardial infarction involving left anterior descending coronary artery: Secondary | ICD-10-CM | POA: Diagnosis not present

## 2022-10-22 DIAGNOSIS — I8290 Acute embolism and thrombosis of unspecified vein: Secondary | ICD-10-CM | POA: Diagnosis not present

## 2022-10-22 DIAGNOSIS — N1831 Chronic kidney disease, stage 3a: Secondary | ICD-10-CM | POA: Diagnosis not present

## 2022-10-22 DIAGNOSIS — I824Y9 Acute embolism and thrombosis of unspecified deep veins of unspecified proximal lower extremity: Secondary | ICD-10-CM | POA: Diagnosis not present

## 2022-12-10 ENCOUNTER — Other Ambulatory Visit (INDEPENDENT_AMBULATORY_CARE_PROVIDER_SITE_OTHER): Payer: Self-pay | Admitting: Nurse Practitioner

## 2022-12-20 DIAGNOSIS — N1831 Chronic kidney disease, stage 3a: Secondary | ICD-10-CM | POA: Diagnosis not present

## 2022-12-20 DIAGNOSIS — I2102 ST elevation (STEMI) myocardial infarction involving left anterior descending coronary artery: Secondary | ICD-10-CM | POA: Diagnosis not present

## 2022-12-20 DIAGNOSIS — I8290 Acute embolism and thrombosis of unspecified vein: Secondary | ICD-10-CM | POA: Diagnosis not present

## 2022-12-20 DIAGNOSIS — Z23 Encounter for immunization: Secondary | ICD-10-CM | POA: Diagnosis not present

## 2023-03-25 DIAGNOSIS — Z0000 Encounter for general adult medical examination without abnormal findings: Secondary | ICD-10-CM | POA: Diagnosis not present

## 2023-03-25 DIAGNOSIS — I2583 Coronary atherosclerosis due to lipid rich plaque: Secondary | ICD-10-CM | POA: Diagnosis not present

## 2023-03-25 DIAGNOSIS — F0390 Unspecified dementia without behavioral disturbance: Secondary | ICD-10-CM | POA: Diagnosis not present

## 2023-03-25 DIAGNOSIS — N401 Enlarged prostate with lower urinary tract symptoms: Secondary | ICD-10-CM | POA: Diagnosis not present

## 2023-03-25 DIAGNOSIS — R413 Other amnesia: Secondary | ICD-10-CM | POA: Diagnosis not present

## 2023-03-25 DIAGNOSIS — R351 Nocturia: Secondary | ICD-10-CM | POA: Diagnosis not present

## 2023-03-25 DIAGNOSIS — I8290 Acute embolism and thrombosis of unspecified vein: Secondary | ICD-10-CM | POA: Diagnosis not present

## 2023-03-25 DIAGNOSIS — Z7901 Long term (current) use of anticoagulants: Secondary | ICD-10-CM | POA: Diagnosis not present

## 2023-03-25 DIAGNOSIS — I2510 Atherosclerotic heart disease of native coronary artery without angina pectoris: Secondary | ICD-10-CM | POA: Diagnosis not present

## 2023-03-25 DIAGNOSIS — N1831 Chronic kidney disease, stage 3a: Secondary | ICD-10-CM | POA: Diagnosis not present

## 2023-03-26 ENCOUNTER — Ambulatory Visit (INDEPENDENT_AMBULATORY_CARE_PROVIDER_SITE_OTHER): Payer: PPO | Admitting: Vascular Surgery

## 2023-04-12 ENCOUNTER — Ambulatory Visit (INDEPENDENT_AMBULATORY_CARE_PROVIDER_SITE_OTHER): Payer: PPO | Admitting: Vascular Surgery

## 2023-04-23 ENCOUNTER — Ambulatory Visit (INDEPENDENT_AMBULATORY_CARE_PROVIDER_SITE_OTHER): Payer: PPO | Admitting: Vascular Surgery

## 2023-05-07 DIAGNOSIS — I2102 ST elevation (STEMI) myocardial infarction involving left anterior descending coronary artery: Secondary | ICD-10-CM | POA: Diagnosis not present

## 2023-05-07 DIAGNOSIS — N1831 Chronic kidney disease, stage 3a: Secondary | ICD-10-CM | POA: Diagnosis not present

## 2023-05-07 DIAGNOSIS — I8290 Acute embolism and thrombosis of unspecified vein: Secondary | ICD-10-CM | POA: Diagnosis not present

## 2023-05-11 ENCOUNTER — Other Ambulatory Visit (INDEPENDENT_AMBULATORY_CARE_PROVIDER_SITE_OTHER): Payer: Self-pay | Admitting: Nurse Practitioner

## 2023-05-13 NOTE — Telephone Encounter (Signed)
Patient will need contact the office to schedule appointment

## 2023-06-29 ENCOUNTER — Other Ambulatory Visit (INDEPENDENT_AMBULATORY_CARE_PROVIDER_SITE_OTHER): Payer: Self-pay | Admitting: Nurse Practitioner

## 2023-08-13 ENCOUNTER — Other Ambulatory Visit (INDEPENDENT_AMBULATORY_CARE_PROVIDER_SITE_OTHER): Payer: Self-pay | Admitting: Nurse Practitioner

## 2023-08-13 NOTE — Telephone Encounter (Signed)
Please call this patient to make a follow up appt. To continue proper treatment

## 2023-08-13 NOTE — Telephone Encounter (Signed)
LVM for daughter to call and have patient scheduled for medication refill/continued care  LS 03/2022. per telephone encounter - bring in for medication refills/follow up care. see jd/fb

## 2023-09-21 ENCOUNTER — Other Ambulatory Visit (INDEPENDENT_AMBULATORY_CARE_PROVIDER_SITE_OTHER): Payer: Self-pay | Admitting: Nurse Practitioner

## 2023-10-21 ENCOUNTER — Ambulatory Visit (INDEPENDENT_AMBULATORY_CARE_PROVIDER_SITE_OTHER): Payer: PPO | Admitting: Nurse Practitioner

## 2023-10-22 ENCOUNTER — Other Ambulatory Visit (INDEPENDENT_AMBULATORY_CARE_PROVIDER_SITE_OTHER): Payer: Self-pay | Admitting: Nurse Practitioner

## 2023-10-30 ENCOUNTER — Encounter (INDEPENDENT_AMBULATORY_CARE_PROVIDER_SITE_OTHER): Payer: Self-pay | Admitting: Nurse Practitioner

## 2023-10-30 ENCOUNTER — Ambulatory Visit (INDEPENDENT_AMBULATORY_CARE_PROVIDER_SITE_OTHER): Payer: PPO | Admitting: Nurse Practitioner

## 2023-10-30 VITALS — BP 135/80 | HR 66 | Resp 16

## 2023-10-30 DIAGNOSIS — I82512 Chronic embolism and thrombosis of left femoral vein: Secondary | ICD-10-CM | POA: Diagnosis not present

## 2023-10-30 MED ORDER — APIXABAN 2.5 MG PO TABS: 2.5000 mg | ORAL_TABLET | Freq: Two times a day (BID) | ORAL | 11 refills | Status: AC

## 2023-11-04 ENCOUNTER — Encounter (INDEPENDENT_AMBULATORY_CARE_PROVIDER_SITE_OTHER): Payer: Self-pay | Admitting: Nurse Practitioner

## 2023-11-04 NOTE — Progress Notes (Signed)
 Subjective:    Patient ID: Bryan Garza, male    DOB: 05-Jul-1934, 89 y.o.   MRN: [id number] Chief Complaint  Patient presents with   Follow-up    Follow up to continue medication refills     Patient returns today in follow up of chronic left lower extremity DVT as well as some chronic swelling.    He has very mild left lower extremity swelling at baseline.  This is not bothersome to him.  He overall is doing well.  He remains on 2.5 mg twice daily of Eliquis and has tolerated that well.    Review of Systems     Objective:   Physical Exam  BP 135/80   Pulse 66   Resp 16   Past Medical History:  Diagnosis Date   Arthritis     Social History   Socioeconomic History   Marital status: Married    Spouse name: Not on file   Number of children: Not on file   Years of education: Not on file   Highest education level: Not on file  Occupational History   Not on file  Tobacco Use   Smoking status: Former   Smokeless tobacco: Never  Substance and Sexual Activity   Alcohol use: No   Drug use: Never   Sexual activity: Not on file  Other Topics Concern   Not on file  Social History Narrative   Not on file   Social Drivers of Health   Financial Resource Strain: Low Risk  (03/25/2023)   Received from [facility]   Overall Financial Resource Strain (CARDIA)    Difficulty of Paying Living Expenses: Not hard at all  Food Insecurity: No Food Insecurity (03/25/2023)   Received from [facility]   Hunger Vital Sign    Worried About Running Out of Food in the Last Year: Never true    Ran Out of Food in the Last Year: Never true  Transportation Needs: No Transportation Needs (03/25/2023)   Received from [facility] - Transportation    In the past 12 months, has lack of transportation kept you from medical appointments or from getting medications?: No    Lack of Transportation (Non-Medical): No  Physical  Activity: Not on file  Stress: Not on file  Social Connections: Not on file  Intimate Partner Violence: Not on file    Past Surgical History:  Procedure Laterality Date   CORONARY/GRAFT ACUTE MI REVASCULARIZATION N/A 10/06/2022   Procedure: Coronary/Graft Acute MI Revascularization;  Surgeon: Marcina Millard, MD;  Location: [facility] INVASIVE CV LAB;  Service: Cardiovascular;  Laterality: N/A;   IABP INSERTION Right 10/06/2022   Procedure: IABP Insertion;  Surgeon: Marcina Millard, MD;  Location: [facility] INVASIVE CV LAB;  Service: Cardiovascular;  Laterality: Right;   LEFT HEART CATH AND CORONARY ANGIOGRAPHY N/A 10/06/2022   Procedure: LEFT HEART CATH AND CORONARY ANGIOGRAPHY;  Surgeon: Marcina Millard, MD;  Location: [facility] INVASIVE CV LAB;  Service: Cardiovascular;  Laterality: N/A;   MINOR HEMORRHOIDECTOMY     PERIPHERAL VASCULAR THROMBECTOMY Left 03/16/2020   Procedure: PERIPHERAL VASCULAR THROMBECTOMY;  Surgeon: Annice Needy, MD;  Location: [facility] INVASIVE CV LAB;  Service: Cardiovascular;  Laterality: Left;    Family History  Problem Relation Age of Onset   Lung cancer Father     Allergies  Allergen Reactions   Tetracyclines & Related Nausea And Vomiting    Flu symptoms  Latest Ref Rng & Units 10/11/2022    3:54 AM 10/10/2022    5:54 AM 10/09/2022    1:09 AM  CBC  WBC 4.0 - 10.5 K/uL 5.9  6.5  7.6   Hemoglobin 13.0 - 17.0 g/dL 30.8  65.7  84.6   Hematocrit 39.0 - 52.0 % 30.0  31.9  33.1   Platelets 150 - 400 K/uL 125  102  92       CMP     Component Value Date/Time   NA 137 10/11/2022 0354   K 3.7 10/11/2022 0354   CL 107 10/11/2022 0354   CO2 22 10/11/2022 0354   GLUCOSE 94 10/11/2022 0354   BUN 33 (H) 10/11/2022 0354   CREATININE 1.44 (H) 10/11/2022 0354   CALCIUM 8.3 (L) 10/11/2022 0354   PROT 5.3 (L) 10/08/2022 0425   ALBUMIN 3.0 (L) 10/08/2022 0425   AST 193 (H) 10/08/2022 0425   ALT 53 (H) 10/08/2022 0425   ALKPHOS 46 10/08/2022 0425   BILITOT  0.8 10/08/2022 0425   GFRNONAA 47 (L) 10/11/2022 0354     No results found.     Assessment & Plan:   1. Chronic deep vein thrombosis (DVT) of femoral vein of left lower extremity (HCC) (Primary) Recommend:   No surgery or intervention at this point in time.  IVC filter is not indicated at present.  He remains on 2.5 mg of Eliquis and is tolerating this well.  We have sent in a refill for the patient today.  The patient will continue anticoagulation for now as there have not been any problems or complications from anticoagulation therapy at this point.  The patient will follow-up with me with noninvasive studies as ordered.   Current Outpatient Medications on File Prior to Visit  Medication Sig Dispense Refill   carvedilol (COREG) 3.125 MG tablet Take 1 tablet (3.125 mg total) by mouth 2 (two) times daily with a meal. 180 tablet 3   clopidogrel (PLAVIX) 75 MG tablet Take 1 tablet (75 mg total) by mouth daily. 90 tablet 3   donepezil (ARICEPT) 10 MG tablet Take 10 mg by mouth at bedtime.     famotidine (PEPCID) 40 MG tablet Take 40 mg by mouth at bedtime.     finasteride (PROSCAR) 5 MG tablet Take 5 mg by mouth daily.     memantine (NAMENDA) 5 MG tablet Take 5 mg by mouth 2 (two) times daily.     Multiple Vitamin (MULTIVITAMIN WITH MINERALS) TABS tablet Take 1 tablet by mouth daily.     pantoprazole (PROTONIX) 40 MG tablet Take 1 tablet (40 mg total) by mouth daily. 90 tablet 3   QUEtiapine (SEROQUEL) 50 MG tablet Take 50 mg by mouth at bedtime.     rosuvastatin (CRESTOR) 20 MG tablet Take 1 tablet (20 mg total) by mouth at bedtime. 90 tablet 3   tamsulosin (FLOMAX) 0.4 MG CAPS capsule Take 0.8 mg by mouth daily.     nitroGLYCERIN (NITROSTAT) 0.4 MG SL tablet Place 1 tablet (0.4 mg total) under the tongue every 5 (five) minutes as needed for chest pain. 25 tablet 3   No current facility-administered medications on file prior to visit.    There are no Patient Instructions on file  for this visit. No follow-ups on file.   Georgiana Spinner, NP

## 2023-11-21 DIAGNOSIS — F0390 Unspecified dementia without behavioral disturbance: Secondary | ICD-10-CM | POA: Diagnosis not present

## 2023-11-21 DIAGNOSIS — N401 Enlarged prostate with lower urinary tract symptoms: Secondary | ICD-10-CM | POA: Diagnosis not present

## 2023-11-21 DIAGNOSIS — I2583 Coronary atherosclerosis due to lipid rich plaque: Secondary | ICD-10-CM | POA: Diagnosis not present

## 2023-11-21 DIAGNOSIS — I8290 Acute embolism and thrombosis of unspecified vein: Secondary | ICD-10-CM | POA: Diagnosis not present

## 2023-11-21 DIAGNOSIS — N1831 Chronic kidney disease, stage 3a: Secondary | ICD-10-CM | POA: Diagnosis not present

## 2023-11-21 DIAGNOSIS — I2510 Atherosclerotic heart disease of native coronary artery without angina pectoris: Secondary | ICD-10-CM | POA: Diagnosis not present

## 2023-11-21 DIAGNOSIS — Z0000 Encounter for general adult medical examination without abnormal findings: Secondary | ICD-10-CM | POA: Diagnosis not present

## 2023-11-21 DIAGNOSIS — K219 Gastro-esophageal reflux disease without esophagitis: Secondary | ICD-10-CM | POA: Diagnosis not present

## 2023-11-21 DIAGNOSIS — R351 Nocturia: Secondary | ICD-10-CM | POA: Diagnosis not present

## 2023-11-21 DIAGNOSIS — Z7901 Long term (current) use of anticoagulants: Secondary | ICD-10-CM | POA: Diagnosis not present

## 2023-11-21 DIAGNOSIS — I2102 ST elevation (STEMI) myocardial infarction involving left anterior descending coronary artery: Secondary | ICD-10-CM | POA: Diagnosis not present

## 2024-04-07 DIAGNOSIS — R630 Anorexia: Secondary | ICD-10-CM | POA: Diagnosis not present

## 2024-04-07 DIAGNOSIS — R634 Abnormal weight loss: Secondary | ICD-10-CM | POA: Diagnosis not present

## 2024-04-07 DIAGNOSIS — N1832 Chronic kidney disease, stage 3b: Secondary | ICD-10-CM | POA: Diagnosis not present

## 2024-04-07 DIAGNOSIS — R5383 Other fatigue: Secondary | ICD-10-CM | POA: Diagnosis not present

## 2024-05-27 DIAGNOSIS — Z1331 Encounter for screening for depression: Secondary | ICD-10-CM | POA: Diagnosis not present

## 2024-05-27 DIAGNOSIS — R0681 Apnea, not elsewhere classified: Secondary | ICD-10-CM | POA: Diagnosis not present

## 2024-05-27 DIAGNOSIS — N1832 Chronic kidney disease, stage 3b: Secondary | ICD-10-CM | POA: Diagnosis not present

## 2024-05-27 DIAGNOSIS — I2583 Coronary atherosclerosis due to lipid rich plaque: Secondary | ICD-10-CM | POA: Diagnosis not present

## 2024-05-27 DIAGNOSIS — I2102 ST elevation (STEMI) myocardial infarction involving left anterior descending coronary artery: Secondary | ICD-10-CM | POA: Diagnosis not present

## 2024-05-27 DIAGNOSIS — Z23 Encounter for immunization: Secondary | ICD-10-CM | POA: Diagnosis not present

## 2024-05-27 DIAGNOSIS — I8290 Acute embolism and thrombosis of unspecified vein: Secondary | ICD-10-CM | POA: Diagnosis not present

## 2024-05-27 DIAGNOSIS — G4710 Hypersomnia, unspecified: Secondary | ICD-10-CM | POA: Diagnosis not present

## 2024-05-27 DIAGNOSIS — I2510 Atherosclerotic heart disease of native coronary artery without angina pectoris: Secondary | ICD-10-CM | POA: Diagnosis not present

## 2024-05-27 DIAGNOSIS — F0390 Unspecified dementia without behavioral disturbance: Secondary | ICD-10-CM | POA: Diagnosis not present

## 2024-05-27 DIAGNOSIS — N1831 Chronic kidney disease, stage 3a: Secondary | ICD-10-CM | POA: Diagnosis not present

## 2024-05-27 DIAGNOSIS — R634 Abnormal weight loss: Secondary | ICD-10-CM | POA: Diagnosis not present

## 2024-05-27 DIAGNOSIS — R0683 Snoring: Secondary | ICD-10-CM | POA: Diagnosis not present

## 2024-08-09 ENCOUNTER — Other Ambulatory Visit: Payer: Self-pay

## 2024-08-09 ENCOUNTER — Emergency Department

## 2024-08-09 ENCOUNTER — Emergency Department
Admission: EM | Admit: 2024-08-09 | Discharge: 2024-08-10 | Disposition: A | Discharge status: Home / Self Care | Attending: Emergency Medicine | Admitting: Emergency Medicine

## 2024-08-09 DIAGNOSIS — R1011 Right upper quadrant pain: Secondary | ICD-10-CM | POA: Insufficient documentation

## 2024-08-09 DIAGNOSIS — I252 Old myocardial infarction: Secondary | ICD-10-CM | POA: Insufficient documentation

## 2024-08-09 DIAGNOSIS — I2510 Atherosclerotic heart disease of native coronary artery without angina pectoris: Secondary | ICD-10-CM | POA: Diagnosis not present

## 2024-08-09 DIAGNOSIS — Z7982 Long term (current) use of aspirin: Secondary | ICD-10-CM | POA: Diagnosis not present

## 2024-08-09 DIAGNOSIS — F0390 Unspecified dementia without behavioral disturbance: Secondary | ICD-10-CM | POA: Diagnosis not present

## 2024-08-09 DIAGNOSIS — I255 Ischemic cardiomyopathy: Secondary | ICD-10-CM | POA: Diagnosis not present

## 2024-08-09 DIAGNOSIS — N189 Chronic kidney disease, unspecified: Secondary | ICD-10-CM | POA: Insufficient documentation

## 2024-08-09 DIAGNOSIS — Z7902 Long term (current) use of antithrombotics/antiplatelets: Secondary | ICD-10-CM | POA: Insufficient documentation

## 2024-08-09 DIAGNOSIS — K760 Fatty (change of) liver, not elsewhere classified: Secondary | ICD-10-CM | POA: Diagnosis not present

## 2024-08-09 DIAGNOSIS — Z86718 Personal history of other venous thrombosis and embolism: Secondary | ICD-10-CM | POA: Diagnosis not present

## 2024-08-09 DIAGNOSIS — R079 Chest pain, unspecified: Secondary | ICD-10-CM | POA: Diagnosis not present

## 2024-08-09 DIAGNOSIS — R531 Weakness: Secondary | ICD-10-CM | POA: Insufficient documentation

## 2024-08-09 DIAGNOSIS — K8020 Calculus of gallbladder without cholecystitis without obstruction: Secondary | ICD-10-CM | POA: Insufficient documentation

## 2024-08-09 LAB — CBC WITH DIFFERENTIAL/PLATELET
Abs Immature Granulocytes: 0.02 K/uL (ref 0.00–0.07)
Basophils Absolute: 0 K/uL (ref 0.0–0.1)
Basophils Relative: 0 %
Eosinophils Absolute: 0 K/uL (ref 0.0–0.5)
Eosinophils Relative: 0 %
HCT: 43.2 % (ref 39.0–52.0)
Hemoglobin: 14.5 g/dL (ref 13.0–17.0)
Immature Granulocytes: 0 %
Lymphocytes Relative: 17 %
Lymphs Abs: 1.6 K/uL (ref 0.7–4.0)
MCH: 30.2 pg (ref 26.0–34.0)
MCHC: 33.6 g/dL (ref 30.0–36.0)
MCV: 90 fL (ref 80.0–100.0)
Monocytes Absolute: 1.2 K/uL — ABNORMAL HIGH (ref 0.1–1.0)
Monocytes Relative: 13 %
Neutro Abs: 6.5 K/uL (ref 1.7–7.7)
Neutrophils Relative %: 70 %
Platelets: 169 K/uL (ref 150–400)
RBC: 4.8 MIL/uL (ref 4.22–5.81)
RDW: 13.6 % (ref 11.5–15.5)
WBC: 9.3 K/uL (ref 4.0–10.5)
nRBC: 0 % (ref 0.0–0.2)

## 2024-08-09 LAB — COMPREHENSIVE METABOLIC PANEL WITH GFR
ALT: 19 U/L (ref 0–44)
AST: 26 U/L (ref 15–41)
Albumin: 4.2 g/dL (ref 3.5–5.0)
Alkaline Phosphatase: 69 U/L (ref 38–126)
Anion gap: 11 (ref 5–15)
BUN: 22 mg/dL (ref 8–23)
CO2: 25 mmol/L (ref 22–32)
Calcium: 9.3 mg/dL (ref 8.9–10.3)
Chloride: 104 mmol/L (ref 98–111)
Creatinine, Ser: 1.47 mg/dL — ABNORMAL HIGH (ref 0.61–1.24)
GFR, Estimated: 46 mL/min — ABNORMAL LOW (ref >60)
Glucose, Bld: 100 mg/dL — ABNORMAL HIGH (ref 70–99)
Potassium: 4 mmol/L (ref 3.5–5.1)
Sodium: 141 mmol/L (ref 135–145)
Total Bilirubin: 0.5 mg/dL (ref 0.0–1.2)
Total Protein: 6.6 g/dL (ref 6.5–8.1)

## 2024-08-09 LAB — RESP PANEL BY RT-PCR (RSV, FLU A&B, COVID)  RVPGX2
Influenza A by PCR: NEGATIVE
Influenza B by PCR: NEGATIVE
Resp Syncytial Virus by PCR: NEGATIVE
SARS Coronavirus 2 by RT PCR: NEGATIVE

## 2024-08-09 LAB — MAGNESIUM: Magnesium: 2.2 mg/dL (ref 1.7–2.4)

## 2024-08-09 LAB — TROPONIN T, HIGH SENSITIVITY
Troponin T High Sensitivity: 49 ng/L — ABNORMAL HIGH (ref 0–19)
Troponin T High Sensitivity: 44 ng/L — ABNORMAL HIGH (ref 0–19)

## 2024-08-09 LAB — LIPASE, BLOOD: Lipase: 14 U/L (ref 11–51)

## 2024-08-09 MED ORDER — ONDANSETRON HCL 4 MG/2ML IJ SOLN
4.0000 mg | Freq: Once | INTRAMUSCULAR | Status: AC
  Administered 2024-08-09: 4 mg via INTRAVENOUS
  Filled 2024-08-09: qty 2

## 2024-08-09 MED ORDER — MORPHINE SULFATE (PF) 2 MG/ML IV SOLN
2.0000 mg | Freq: Once | INTRAVENOUS | Status: AC
  Administered 2024-08-09: 2 mg via INTRAVENOUS
  Filled 2024-08-09: qty 1

## 2024-08-09 NOTE — ED Notes (Signed)
 Patient provided with cup of ice water and urinal

## 2024-08-09 NOTE — ED Notes (Signed)
 Called CCMD at this time to admit patient for monitoring.

## 2024-08-09 NOTE — ED Provider Notes (Addendum)
 [facility] Provider Note    Event Date/Time   First MD Initiated Contact with Patient 08/09/24 1925     (approximate)   History   Weakness and Abdominal Pain   HPI  Bryan Garza Guy is a 89 y.o. male who presents to the ED for evaluation of Weakness and Abdominal Pain   Reviewed cardiology clinic visit from 2 months ago.  CAD s/p STEMI 09/2022 associated cardiogenic shock requiring balloon pump, ischemic cardiomyopathy EF 30%.  DVT and thrombectomy, CKD.  Now on aspirin  and Plavix  without AC  Patient presents to the ED via EMS from home for evaluation of RUQ abdominal pain and generalized weakness over the past 1 day.   Physical Exam   Triage Vital Signs: ED Triage Vitals  Encounter Vitals Group     BP      Girls Systolic BP Percentile      Girls Diastolic BP Percentile      Boys Systolic BP Percentile      Boys Diastolic BP Percentile      Pulse      Resp      Temp      Temp src      SpO2      Weight      Height      Head Circumference      Peak Flow      Pain Score      Pain Loc      Pain Education      Exclude from Growth Chart     Most recent vital signs: Vitals:   08/09/24 1929 08/09/24 2300  BP: (!) 148/127 (!) 97/59  Pulse: 79 65  Resp: 17 12  Temp: 98.5 F (36.9 C) 98.2 F (36.8 C)  SpO2: 100% 99%    General: Awake, no distress.  CV:  Good peripheral perfusion.  Resp:  Normal effort.  Abd:  No distention.  Localized RUQ tenderness is present MSK:  No deformity noted.  Neuro:  No focal deficits appreciated. Other:     ED Results / Procedures / Treatments   Labs (all labs ordered are listed, but only abnormal results are displayed) Labs Reviewed  COMPREHENSIVE METABOLIC PANEL WITH GFR - Abnormal; Notable for the following components:      Result Value   Glucose, Bld 100 (*)    Creatinine, Ser 1.47 (*)    GFR, Estimated 46 (*)    All other components within normal limits  CBC WITH DIFFERENTIAL/PLATELET -  Abnormal; Notable for the following components:   Monocytes Absolute 1.2 (*)    All other components within normal limits  TROPONIN T, HIGH SENSITIVITY - Abnormal; Notable for the following components:   Troponin T High Sensitivity 49 (*)    All other components within normal limits  TROPONIN T, HIGH SENSITIVITY - Abnormal; Notable for the following components:   Troponin T High Sensitivity 44 (*)    All other components within normal limits  RESP PANEL BY RT-PCR (RSV, FLU A&B, COVID)  RVPGX2  LIPASE, BLOOD  MAGNESIUM   URINALYSIS, ROUTINE W REFLEX MICROSCOPIC    EKG Treatment is baseline clouds fine detail.  Seems to demonstrate a sinus rhythm with a rate of 75 bpm.  Leftward axis, no STEMI  RADIOLOGY CXR interpreted by me without evidence of acute cardiopulmonary pathology. RUQ ultrasound interpreted me with gallstones without complicating features  Official radiology report(s): DG Chest Portable 1 View Result Date: 08/09/2024 EXAM: 1 VIEW(S) XRAY OF  THE CHEST 08/09/2024 08:20:10 PM COMPARISON: 10/08/2022 CLINICAL HISTORY: right sided chest pain FINDINGS: LUNGS AND PLEURA: No focal pulmonary opacity. No pleural effusion. No pneumothorax. HEART AND MEDIASTINUM: No acute abnormality of the cardiac and mediastinal silhouettes. BONES AND SOFT TISSUES: No acute osseous abnormality. IMPRESSION: 1. No acute cardiopulmonary process. Electronically signed by: Franky Crease MD 08/09/2024 08:25 PM EST RP Workstation: [id number]   US  ABDOMEN LIMITED RUQ (LIVER/GB) Result Date: 08/09/2024 EXAM: Right Upper Quadrant Abdominal Ultrasound 08/09/2024 07:56:52 PM TECHNIQUE: Real-time ultrasonography of the right upper quadrant of the abdomen was performed. COMPARISON: Comparison with previous CT abdomen and pelvis dated 01/08/2018. CLINICAL HISTORY: Right upper quadrant abdominal pain. FINDINGS: LIVER: Increased hepatic parenchymal echotexture suggesting diffuse fatty infiltration. No intrahepatic biliary  ductal dilatation. No focal liver lesions. BILIARY SYSTEM: Cholelithiasis with multiple stones in the gallbladder. Gallbladder wall thickness measures 0.3 cm. No pericholecystic fluid or edema. Murphy sign is negative. Common bile duct is within normal limits measuring 2.7 mm. RIGHT KIDNEY: The right kidney is grossly unremarkable in appearances without evidence of hydronephrosis, echogenic calculi or worrisome mass lesions. PANCREAS: Visualized portions of the pancreas are unremarkable. OTHER: No right upper quadrant ascites. IMPRESSION: 1. Cholelithiasis without sonographic evidence of acute cholecystitis. Negative sonographic Murphy sign. 2. Increased hepatic parenchymal echotexture compatible with diffuse hepatic steatosis. No focal liver lesions identified. Electronically signed by: Elsie Gravely MD 08/09/2024 08:03 PM EST RP Workstation: [id number]    PROCEDURES and INTERVENTIONS:  .1-3 Lead EKG Interpretation  Performed by: Claudene Rover, MD Authorized by: Claudene Rover, MD     Interpretation: normal     ECG rate:  65   ECG rate assessment: normal     Rhythm: sinus rhythm     Ectopy: none     Conduction: normal     Medications  morphine  (PF) 2 MG/ML injection 2 mg (2 mg Intravenous Given 08/09/24 1959)  ondansetron  (ZOFRAN ) injection 4 mg (4 mg Intravenous Given 08/09/24 1958)     IMPRESSION / MDM / ASSESSMENT AND PLAN / ED COURSE  I reviewed the triage vital signs and the nursing notes.  Differential diagnosis includes, but is not limited to, ACS, PTX, PNA, muscle strain/spasm, PE, dissection, anxiety, pleural effusion  {Patient presents with symptoms of an acute illness or injury that is potentially life-threatening.  Patient is from home with acute on chronic generalized weakness and right sided discomfort.  Suspect the pain is biliary colic from gallstones.  No complicating features such as cholecystitis, signs of hepatobiliary obstruction.  Nonischemic EKG, flat troponins,  normal CBC, CMP with renal function at baseline, negative viral swabs.  Awaiting UA at the time of signout to oncoming physician.  If this is reassuring patient may be suitable to return home with family  Clinical Course as of 08/09/24 2326  [place] Aug 09, 2024  2011 Reassessed and discussed with family, daughter/son-in-law at the bedside [DS]  2107 Reassessed and discussed workup, plan of care [DS]  2114 Discussed with patient's son in the hallway outside of the room.  They report some increasing [DS]    Clinical Course User Index [DS] Claudene Rover, MD     FINAL CLINICAL IMPRESSION(S) / ED DIAGNOSES   Final diagnoses:  None     Rx / DC Orders   ED Discharge Orders     None        Note:  This document was prepared using Dragon voice recognition software and may include unintentional dictation errors.   Claudene Rover, MD 08/09/24 [phone]  Claudene Rover, MD 08/09/24 [phone]

## 2024-08-09 NOTE — ED Triage Notes (Signed)
 Pt to ED from home with complaints of weakness. Pt's daughter called because pt has been more lethargic thank normal, has been eating and drinking less than normal over the past week. Pt has hx of dementia, pt A&Ox4 which is his baseline mental status per his daughter. Pt c/o RUQ abdominal pain.

## 2024-08-10 ENCOUNTER — Emergency Department

## 2024-08-10 DIAGNOSIS — R9089 Other abnormal findings on diagnostic imaging of central nervous system: Secondary | ICD-10-CM | POA: Diagnosis not present

## 2024-08-10 DIAGNOSIS — R4182 Altered mental status, unspecified: Secondary | ICD-10-CM | POA: Diagnosis not present

## 2024-08-10 LAB — URINALYSIS, ROUTINE W REFLEX MICROSCOPIC
Bilirubin Urine: NEGATIVE
Glucose, UA: NEGATIVE mg/dL
Hgb urine dipstick: NEGATIVE
Ketones, ur: 5 mg/dL — AB
Leukocytes,Ua: NEGATIVE
Nitrite: NEGATIVE
Protein, ur: NEGATIVE mg/dL
Specific Gravity, Urine: 1.027 (ref 1.005–1.030)
pH: 5 (ref 5.0–8.0)

## 2024-08-10 MED ORDER — SODIUM CHLORIDE 0.9 % IV BOLUS
1000.0000 mL | Freq: Once | INTRAVENOUS | Status: AC
  Administered 2024-08-10: 1000 mL via INTRAVENOUS

## 2024-08-10 NOTE — ED Provider Notes (Signed)
 Patient's COVID and urine negative.  Discussion with the patient's family.  They were concerned about somewhat abrupt change in the patient's behavior although stated that they have also been seeing a decline and have been working with primary care provider to get him into a living facility.  Because of the change I did obtain an MRI to evaluate for any acute strokes.  This did not show any acute abnormalities.  I discussed this with the patient's family.  At this time do not think patient requires admission or further emergent workup.  Will plan on discharging.  Encourage family to continue working with primary care provider for further care needs.   Floy Roberts, MD 08/10/24 [phone]

## 2024-08-26 DIAGNOSIS — R634 Abnormal weight loss: Secondary | ICD-10-CM | POA: Diagnosis not present

## 2024-08-26 DIAGNOSIS — N401 Enlarged prostate with lower urinary tract symptoms: Secondary | ICD-10-CM | POA: Diagnosis not present

## 2024-08-26 DIAGNOSIS — R351 Nocturia: Secondary | ICD-10-CM | POA: Diagnosis not present

## 2024-08-26 DIAGNOSIS — N1832 Chronic kidney disease, stage 3b: Secondary | ICD-10-CM | POA: Diagnosis not present

## 2024-08-26 DIAGNOSIS — R1084 Generalized abdominal pain: Secondary | ICD-10-CM | POA: Diagnosis not present

## 2024-08-26 DIAGNOSIS — I8290 Acute embolism and thrombosis of unspecified vein: Secondary | ICD-10-CM | POA: Diagnosis not present

## 2024-08-26 DIAGNOSIS — I2510 Atherosclerotic heart disease of native coronary artery without angina pectoris: Secondary | ICD-10-CM | POA: Diagnosis not present

## 2024-08-26 DIAGNOSIS — F0390 Unspecified dementia without behavioral disturbance: Secondary | ICD-10-CM | POA: Diagnosis not present

## 2024-08-26 DIAGNOSIS — I2583 Coronary atherosclerosis due to lipid rich plaque: Secondary | ICD-10-CM | POA: Diagnosis not present

## 2024-08-26 DIAGNOSIS — N1831 Chronic kidney disease, stage 3a: Secondary | ICD-10-CM | POA: Diagnosis not present

## 2024-10-29 ENCOUNTER — Ambulatory Visit (INDEPENDENT_AMBULATORY_CARE_PROVIDER_SITE_OTHER): Payer: PPO | Admitting: Nurse Practitioner
